# Patient Record
Sex: Female | Born: 1935 | Race: White | Hispanic: No | Marital: Married | Smoking: Never smoker
Health system: Southern US, Community
[De-identification: ages and names within clinical notes are randomized; demographics above are authoritative.]

## PROBLEM LIST (undated history)

## (undated) DIAGNOSIS — I10 Essential (primary) hypertension: Secondary | ICD-10-CM

## (undated) DIAGNOSIS — N189 Chronic kidney disease, unspecified: Secondary | ICD-10-CM

## (undated) DIAGNOSIS — R0600 Dyspnea, unspecified: Secondary | ICD-10-CM

## (undated) DIAGNOSIS — I2510 Atherosclerotic heart disease of native coronary artery without angina pectoris: Secondary | ICD-10-CM

## (undated) DIAGNOSIS — T4145XA Adverse effect of unspecified anesthetic, initial encounter: Secondary | ICD-10-CM

## (undated) DIAGNOSIS — T8859XA Other complications of anesthesia, initial encounter: Secondary | ICD-10-CM

## (undated) DIAGNOSIS — M1990 Unspecified osteoarthritis, unspecified site: Secondary | ICD-10-CM

## (undated) DIAGNOSIS — F32A Depression, unspecified: Secondary | ICD-10-CM

## (undated) DIAGNOSIS — J449 Chronic obstructive pulmonary disease, unspecified: Secondary | ICD-10-CM

## (undated) DIAGNOSIS — R32 Unspecified urinary incontinence: Secondary | ICD-10-CM

## (undated) DIAGNOSIS — J45909 Unspecified asthma, uncomplicated: Secondary | ICD-10-CM

## (undated) DIAGNOSIS — F329 Major depressive disorder, single episode, unspecified: Secondary | ICD-10-CM

## (undated) DIAGNOSIS — F419 Anxiety disorder, unspecified: Secondary | ICD-10-CM

## (undated) DIAGNOSIS — E039 Hypothyroidism, unspecified: Secondary | ICD-10-CM

## (undated) DIAGNOSIS — C801 Malignant (primary) neoplasm, unspecified: Secondary | ICD-10-CM

## (undated) HISTORY — PX: BACK SURGERY: SHX140

## (undated) HISTORY — PX: BREAST SURGERY: SHX581

## (undated) HISTORY — PX: COLOSTOMY CLOSURE: SHX1381

## (undated) HISTORY — PX: ABDOMINAL HYSTERECTOMY: SHX81

## (undated) HISTORY — PX: COLON RESECTION: SHX5231

## (undated) HISTORY — PX: CHOLECYSTECTOMY: SHX55

## (undated) HISTORY — PX: CARDIAC CATHETERIZATION: SHX172

## (undated) HISTORY — PX: EYE SURGERY: SHX253

---
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
## 1998-08-14 ENCOUNTER — Encounter: Payer: Self-pay | Admitting: *Deleted

## 1998-08-14 ENCOUNTER — Ambulatory Visit (HOSPITAL_COMMUNITY): Admission: RE | Admit: 1998-08-14 | Discharge: 1998-08-14 | Payer: Self-pay | Admitting: *Deleted

## 1998-08-17 ENCOUNTER — Ambulatory Visit (HOSPITAL_COMMUNITY): Admission: RE | Admit: 1998-08-17 | Discharge: 1998-08-18 | Payer: Self-pay | Admitting: *Deleted

## 1998-08-17 ENCOUNTER — Encounter: Payer: Self-pay | Admitting: *Deleted

## 1999-05-28 ENCOUNTER — Inpatient Hospital Stay (HOSPITAL_COMMUNITY): Admission: EM | Admit: 1999-05-28 | Discharge: 1999-05-29 | Payer: Self-pay | Admitting: *Deleted

## 1999-05-29 ENCOUNTER — Encounter: Payer: Self-pay | Admitting: Cardiology

## 1999-06-08 ENCOUNTER — Encounter: Payer: Self-pay | Admitting: Surgery

## 1999-06-08 ENCOUNTER — Encounter: Admission: RE | Admit: 1999-06-08 | Discharge: 1999-06-08 | Payer: Self-pay | Admitting: Surgery

## 1999-12-21 ENCOUNTER — Ambulatory Visit (HOSPITAL_COMMUNITY): Admission: RE | Admit: 1999-12-21 | Discharge: 1999-12-21 | Payer: Self-pay | Admitting: Internal Medicine

## 2000-01-16 ENCOUNTER — Ambulatory Visit (HOSPITAL_COMMUNITY): Admission: RE | Admit: 2000-01-16 | Discharge: 2000-01-16 | Payer: Self-pay | Admitting: *Deleted

## 2000-01-16 ENCOUNTER — Encounter: Payer: Self-pay | Admitting: *Deleted

## 2000-01-21 ENCOUNTER — Encounter: Payer: Self-pay | Admitting: *Deleted

## 2000-01-21 ENCOUNTER — Ambulatory Visit (HOSPITAL_COMMUNITY): Admission: RE | Admit: 2000-01-21 | Discharge: 2000-01-21 | Payer: Self-pay | Admitting: *Deleted

## 2000-01-31 ENCOUNTER — Encounter: Payer: Self-pay | Admitting: *Deleted

## 2000-01-31 ENCOUNTER — Inpatient Hospital Stay (HOSPITAL_COMMUNITY): Admission: RE | Admit: 2000-01-31 | Discharge: 2000-02-01 | Payer: Self-pay | Admitting: *Deleted

## 2000-02-01 ENCOUNTER — Encounter: Payer: Self-pay | Admitting: *Deleted

## 2000-04-13 ENCOUNTER — Encounter: Payer: Self-pay | Admitting: Internal Medicine

## 2000-04-13 ENCOUNTER — Observation Stay (HOSPITAL_COMMUNITY): Admission: EM | Admit: 2000-04-13 | Discharge: 2000-04-14 | Payer: Self-pay | Admitting: Emergency Medicine

## 2000-06-09 ENCOUNTER — Encounter: Payer: Self-pay | Admitting: Surgery

## 2000-06-09 ENCOUNTER — Encounter: Admission: RE | Admit: 2000-06-09 | Discharge: 2000-06-09 | Payer: Self-pay | Admitting: Surgery

## 2000-08-09 ENCOUNTER — Other Ambulatory Visit: Admission: RE | Admit: 2000-08-09 | Discharge: 2000-08-09 | Payer: Self-pay | Admitting: Family Medicine

## 2000-08-14 ENCOUNTER — Encounter: Admission: RE | Admit: 2000-08-14 | Discharge: 2000-08-14 | Payer: Self-pay | Admitting: Family Medicine

## 2000-08-14 ENCOUNTER — Encounter: Payer: Self-pay | Admitting: Family Medicine

## 2000-09-12 ENCOUNTER — Encounter: Payer: Self-pay | Admitting: Family Medicine

## 2000-09-12 ENCOUNTER — Encounter: Admission: RE | Admit: 2000-09-12 | Discharge: 2000-09-12 | Payer: Self-pay | Admitting: Family Medicine

## 2000-09-19 ENCOUNTER — Encounter (INDEPENDENT_AMBULATORY_CARE_PROVIDER_SITE_OTHER): Payer: Self-pay

## 2000-09-19 ENCOUNTER — Other Ambulatory Visit: Admission: RE | Admit: 2000-09-19 | Discharge: 2000-09-19 | Payer: Self-pay | Admitting: Gastroenterology

## 2000-09-25 ENCOUNTER — Encounter: Payer: Self-pay | Admitting: Gastroenterology

## 2000-09-25 ENCOUNTER — Ambulatory Visit (HOSPITAL_COMMUNITY): Admission: RE | Admit: 2000-09-25 | Discharge: 2000-09-25 | Payer: Self-pay | Admitting: Gastroenterology

## 2000-10-18 ENCOUNTER — Ambulatory Visit (HOSPITAL_COMMUNITY): Admission: RE | Admit: 2000-10-18 | Discharge: 2000-10-18 | Payer: Self-pay | Admitting: Gastroenterology

## 2000-10-18 ENCOUNTER — Encounter (INDEPENDENT_AMBULATORY_CARE_PROVIDER_SITE_OTHER): Payer: Self-pay | Admitting: Specialist

## 2000-11-24 ENCOUNTER — Ambulatory Visit (HOSPITAL_COMMUNITY): Admission: RE | Admit: 2000-11-24 | Discharge: 2000-11-24 | Payer: Self-pay | Admitting: Internal Medicine

## 2000-12-08 ENCOUNTER — Encounter: Payer: Self-pay | Admitting: Gastroenterology

## 2000-12-08 ENCOUNTER — Ambulatory Visit (HOSPITAL_COMMUNITY): Admission: RE | Admit: 2000-12-08 | Discharge: 2000-12-08 | Payer: Self-pay | Admitting: Gastroenterology

## 2001-04-24 ENCOUNTER — Encounter: Payer: Self-pay | Admitting: Surgery

## 2001-04-24 ENCOUNTER — Encounter: Admission: RE | Admit: 2001-04-24 | Discharge: 2001-04-24 | Payer: Self-pay | Admitting: Surgery

## 2002-05-15 ENCOUNTER — Encounter: Admission: RE | Admit: 2002-05-15 | Discharge: 2002-05-15 | Payer: Self-pay | Admitting: Internal Medicine

## 2002-05-15 ENCOUNTER — Encounter: Payer: Self-pay | Admitting: Internal Medicine

## 2002-08-11 ENCOUNTER — Emergency Department (HOSPITAL_COMMUNITY): Admission: EM | Admit: 2002-08-11 | Discharge: 2002-08-11 | Payer: Self-pay | Admitting: Emergency Medicine

## 2003-02-12 ENCOUNTER — Encounter: Payer: Self-pay | Admitting: Internal Medicine

## 2003-02-12 ENCOUNTER — Inpatient Hospital Stay (HOSPITAL_COMMUNITY): Admission: AD | Admit: 2003-02-12 | Discharge: 2003-02-14 | Payer: Self-pay | Admitting: Internal Medicine

## 2003-02-13 ENCOUNTER — Encounter: Payer: Self-pay | Admitting: Internal Medicine

## 2003-02-14 ENCOUNTER — Encounter: Payer: Self-pay | Admitting: Internal Medicine

## 2003-06-04 ENCOUNTER — Encounter: Admission: RE | Admit: 2003-06-04 | Discharge: 2003-06-04 | Payer: Self-pay | Admitting: Internal Medicine

## 2003-11-12 ENCOUNTER — Inpatient Hospital Stay (HOSPITAL_COMMUNITY): Admission: EM | Admit: 2003-11-12 | Discharge: 2003-11-17 | Payer: Self-pay | Admitting: Emergency Medicine

## 2004-01-08 ENCOUNTER — Ambulatory Visit (HOSPITAL_COMMUNITY): Admission: RE | Admit: 2004-01-08 | Discharge: 2004-01-08 | Payer: Self-pay | Admitting: Internal Medicine

## 2004-04-13 ENCOUNTER — Ambulatory Visit: Payer: Self-pay | Admitting: Internal Medicine

## 2004-04-13 ENCOUNTER — Inpatient Hospital Stay (HOSPITAL_COMMUNITY): Admission: EM | Admit: 2004-04-13 | Discharge: 2004-04-16 | Payer: Self-pay | Admitting: Emergency Medicine

## 2004-04-13 ENCOUNTER — Ambulatory Visit: Payer: Self-pay | Admitting: *Deleted

## 2004-04-14 ENCOUNTER — Ambulatory Visit: Payer: Self-pay | Admitting: Internal Medicine

## 2004-04-15 ENCOUNTER — Encounter: Payer: Self-pay | Admitting: Cardiology

## 2004-04-16 ENCOUNTER — Ambulatory Visit: Payer: Self-pay | Admitting: Internal Medicine

## 2004-04-21 ENCOUNTER — Ambulatory Visit: Payer: Self-pay | Admitting: Internal Medicine

## 2004-05-12 ENCOUNTER — Ambulatory Visit: Payer: Self-pay | Admitting: Internal Medicine

## 2004-06-15 ENCOUNTER — Encounter: Admission: RE | Admit: 2004-06-15 | Discharge: 2004-06-15 | Payer: Self-pay | Admitting: Surgery

## 2004-07-13 ENCOUNTER — Ambulatory Visit: Payer: Self-pay | Admitting: Family Medicine

## 2004-08-10 ENCOUNTER — Ambulatory Visit: Payer: Self-pay | Admitting: Internal Medicine

## 2004-09-01 ENCOUNTER — Ambulatory Visit: Payer: Self-pay | Admitting: Internal Medicine

## 2004-09-21 ENCOUNTER — Ambulatory Visit: Payer: Self-pay | Admitting: Family Medicine

## 2004-09-29 ENCOUNTER — Ambulatory Visit: Payer: Self-pay | Admitting: Internal Medicine

## 2004-10-27 ENCOUNTER — Ambulatory Visit: Payer: Self-pay | Admitting: Gastroenterology

## 2004-11-02 ENCOUNTER — Ambulatory Visit: Payer: Self-pay | Admitting: Gastroenterology

## 2004-11-24 ENCOUNTER — Ambulatory Visit: Payer: Self-pay | Admitting: Gastroenterology

## 2004-12-01 ENCOUNTER — Ambulatory Visit (HOSPITAL_COMMUNITY): Admission: RE | Admit: 2004-12-01 | Discharge: 2004-12-01 | Payer: Self-pay | Admitting: Gastroenterology

## 2004-12-01 ENCOUNTER — Ambulatory Visit: Payer: Self-pay | Admitting: Internal Medicine

## 2004-12-13 ENCOUNTER — Ambulatory Visit: Payer: Self-pay | Admitting: Family Medicine

## 2004-12-15 ENCOUNTER — Ambulatory Visit: Payer: Self-pay | Admitting: Internal Medicine

## 2004-12-17 ENCOUNTER — Ambulatory Visit: Payer: Self-pay | Admitting: Gastroenterology

## 2004-12-22 ENCOUNTER — Ambulatory Visit: Payer: Self-pay | Admitting: Internal Medicine

## 2005-01-05 ENCOUNTER — Ambulatory Visit: Payer: Self-pay | Admitting: Internal Medicine

## 2005-01-14 ENCOUNTER — Ambulatory Visit: Payer: Self-pay | Admitting: Gastroenterology

## 2005-03-23 ENCOUNTER — Ambulatory Visit: Payer: Self-pay | Admitting: Internal Medicine

## 2005-05-18 ENCOUNTER — Ambulatory Visit: Payer: Self-pay | Admitting: Internal Medicine

## 2005-06-08 ENCOUNTER — Ambulatory Visit: Payer: Self-pay | Admitting: Internal Medicine

## 2005-06-17 ENCOUNTER — Encounter: Admission: RE | Admit: 2005-06-17 | Discharge: 2005-06-17 | Payer: Self-pay | Admitting: Internal Medicine

## 2005-07-13 ENCOUNTER — Ambulatory Visit: Payer: Self-pay | Admitting: Internal Medicine

## 2005-09-14 ENCOUNTER — Ambulatory Visit: Payer: Self-pay | Admitting: Internal Medicine

## 2005-09-19 ENCOUNTER — Ambulatory Visit: Payer: Self-pay | Admitting: Internal Medicine

## 2005-09-21 ENCOUNTER — Ambulatory Visit: Payer: Self-pay | Admitting: Internal Medicine

## 2005-09-26 ENCOUNTER — Ambulatory Visit: Payer: Self-pay | Admitting: Internal Medicine

## 2005-09-29 ENCOUNTER — Ambulatory Visit: Payer: Self-pay | Admitting: Internal Medicine

## 2005-09-30 ENCOUNTER — Inpatient Hospital Stay (HOSPITAL_COMMUNITY): Admission: EM | Admit: 2005-09-30 | Discharge: 2005-10-03 | Payer: Self-pay | Admitting: Emergency Medicine

## 2005-10-03 ENCOUNTER — Ambulatory Visit: Payer: Self-pay | Admitting: Internal Medicine

## 2005-10-04 ENCOUNTER — Ambulatory Visit: Payer: Self-pay | Admitting: Internal Medicine

## 2005-10-06 ENCOUNTER — Ambulatory Visit: Payer: Self-pay | Admitting: Internal Medicine

## 2005-10-18 ENCOUNTER — Ambulatory Visit: Payer: Self-pay | Admitting: Gastroenterology

## 2005-10-25 ENCOUNTER — Ambulatory Visit: Payer: Self-pay | Admitting: Gastroenterology

## 2005-11-09 ENCOUNTER — Ambulatory Visit: Payer: Self-pay | Admitting: Internal Medicine

## 2006-02-01 ENCOUNTER — Ambulatory Visit: Payer: Self-pay | Admitting: Internal Medicine

## 2006-04-18 ENCOUNTER — Ambulatory Visit: Payer: Self-pay | Admitting: Internal Medicine

## 2006-04-19 ENCOUNTER — Ambulatory Visit: Payer: Self-pay

## 2006-04-25 ENCOUNTER — Observation Stay (HOSPITAL_COMMUNITY): Admission: EM | Admit: 2006-04-25 | Discharge: 2006-04-26 | Payer: Self-pay | Admitting: Emergency Medicine

## 2006-04-25 ENCOUNTER — Ambulatory Visit: Payer: Self-pay | Admitting: Cardiology

## 2006-05-03 ENCOUNTER — Ambulatory Visit: Payer: Self-pay | Admitting: Internal Medicine

## 2006-05-12 ENCOUNTER — Ambulatory Visit (HOSPITAL_COMMUNITY): Admission: RE | Admit: 2006-05-12 | Discharge: 2006-05-12 | Payer: Self-pay | Admitting: Internal Medicine

## 2006-05-22 ENCOUNTER — Ambulatory Visit: Payer: Self-pay | Admitting: Internal Medicine

## 2006-06-16 ENCOUNTER — Ambulatory Visit: Payer: Self-pay | Admitting: Internal Medicine

## 2006-06-19 ENCOUNTER — Encounter: Admission: RE | Admit: 2006-06-19 | Discharge: 2006-06-19 | Payer: Self-pay | Admitting: Internal Medicine

## 2006-06-19 ENCOUNTER — Ambulatory Visit: Payer: Self-pay | Admitting: Internal Medicine

## 2006-06-19 LAB — CONVERTED CEMR LAB
BUN: 18 mg/dL (ref 6–23)
CO2: 30 meq/L (ref 19–32)
GFR calc non Af Amer: 46 mL/min
Glomerular Filtration Rate, Af Am: 56 mL/min/{1.73_m2}
Potassium: 3.6 meq/L (ref 3.5–5.1)

## 2006-06-20 ENCOUNTER — Ambulatory Visit: Payer: Self-pay | Admitting: Internal Medicine

## 2006-07-06 ENCOUNTER — Ambulatory Visit: Payer: Self-pay | Admitting: Internal Medicine

## 2006-07-11 ENCOUNTER — Encounter: Admission: RE | Admit: 2006-07-11 | Discharge: 2006-07-11 | Payer: Self-pay | Admitting: Surgery

## 2006-09-22 ENCOUNTER — Ambulatory Visit: Payer: Self-pay | Admitting: Internal Medicine

## 2006-09-22 ENCOUNTER — Ambulatory Visit: Payer: Self-pay

## 2006-09-28 ENCOUNTER — Ambulatory Visit: Payer: Self-pay | Admitting: Internal Medicine

## 2006-10-16 ENCOUNTER — Ambulatory Visit: Payer: Self-pay | Admitting: Internal Medicine

## 2006-11-09 ENCOUNTER — Ambulatory Visit (HOSPITAL_BASED_OUTPATIENT_CLINIC_OR_DEPARTMENT_OTHER): Admission: RE | Admit: 2006-11-09 | Discharge: 2006-11-09 | Payer: Self-pay | Admitting: Orthopedic Surgery

## 2007-01-05 DIAGNOSIS — F3289 Other specified depressive episodes: Secondary | ICD-10-CM | POA: Insufficient documentation

## 2007-01-05 DIAGNOSIS — K222 Esophageal obstruction: Secondary | ICD-10-CM

## 2007-01-05 DIAGNOSIS — Z8719 Personal history of other diseases of the digestive system: Secondary | ICD-10-CM

## 2007-01-05 DIAGNOSIS — E785 Hyperlipidemia, unspecified: Secondary | ICD-10-CM

## 2007-01-05 DIAGNOSIS — I2510 Atherosclerotic heart disease of native coronary artery without angina pectoris: Secondary | ICD-10-CM | POA: Insufficient documentation

## 2007-01-05 DIAGNOSIS — J449 Chronic obstructive pulmonary disease, unspecified: Secondary | ICD-10-CM

## 2007-01-05 DIAGNOSIS — R55 Syncope and collapse: Secondary | ICD-10-CM | POA: Insufficient documentation

## 2007-01-05 DIAGNOSIS — Z853 Personal history of malignant neoplasm of breast: Secondary | ICD-10-CM

## 2007-01-05 DIAGNOSIS — M109 Gout, unspecified: Secondary | ICD-10-CM

## 2007-01-05 DIAGNOSIS — F329 Major depressive disorder, single episode, unspecified: Secondary | ICD-10-CM

## 2007-01-05 DIAGNOSIS — M4800 Spinal stenosis, site unspecified: Secondary | ICD-10-CM

## 2007-01-05 DIAGNOSIS — F411 Generalized anxiety disorder: Secondary | ICD-10-CM | POA: Insufficient documentation

## 2007-01-05 DIAGNOSIS — I10 Essential (primary) hypertension: Secondary | ICD-10-CM | POA: Insufficient documentation

## 2007-01-05 DIAGNOSIS — R609 Edema, unspecified: Secondary | ICD-10-CM

## 2007-01-05 DIAGNOSIS — E039 Hypothyroidism, unspecified: Secondary | ICD-10-CM | POA: Insufficient documentation

## 2007-01-05 DIAGNOSIS — J4489 Other specified chronic obstructive pulmonary disease: Secondary | ICD-10-CM | POA: Insufficient documentation

## 2007-02-08 ENCOUNTER — Ambulatory Visit: Payer: Self-pay | Admitting: Internal Medicine

## 2007-02-08 LAB — CONVERTED CEMR LAB
ALT: 24 units/L (ref 0–35)
AST: 22 units/L (ref 0–37)
Calcium: 9.8 mg/dL (ref 8.4–10.5)
Chloride: 97 meq/L (ref 96–112)
GFR calc non Af Amer: 57 mL/min
HDL: 57.9 mg/dL (ref >39.0)
Hemoglobin, Urine: NEGATIVE
Specific Gravity, Urine: 1.015 (ref 1.000–1.03)
TSH: 4.62 microintl units/mL (ref 0.35–5.50)
Total Protein, Urine: NEGATIVE mg/dL
VLDL: 28 mg/dL (ref 0–40)
pH: 5.5 (ref 5.0–8.0)

## 2007-04-27 ENCOUNTER — Ambulatory Visit: Payer: Self-pay | Admitting: Internal Medicine

## 2007-04-27 DIAGNOSIS — G4700 Insomnia, unspecified: Secondary | ICD-10-CM

## 2007-05-09 ENCOUNTER — Telehealth: Payer: Self-pay | Admitting: Internal Medicine

## 2007-05-11 ENCOUNTER — Ambulatory Visit: Payer: Self-pay | Admitting: Internal Medicine

## 2007-05-11 DIAGNOSIS — R071 Chest pain on breathing: Secondary | ICD-10-CM

## 2007-05-14 ENCOUNTER — Telehealth: Payer: Self-pay | Admitting: Internal Medicine

## 2007-05-15 ENCOUNTER — Encounter: Payer: Self-pay | Admitting: Internal Medicine

## 2007-05-16 ENCOUNTER — Telehealth: Payer: Self-pay | Admitting: Internal Medicine

## 2007-05-21 ENCOUNTER — Telehealth (INDEPENDENT_AMBULATORY_CARE_PROVIDER_SITE_OTHER): Payer: Self-pay | Admitting: *Deleted

## 2007-06-12 ENCOUNTER — Encounter: Payer: Self-pay | Admitting: Internal Medicine

## 2007-06-18 ENCOUNTER — Ambulatory Visit: Payer: Self-pay | Admitting: Internal Medicine

## 2007-06-18 ENCOUNTER — Telehealth: Payer: Self-pay | Admitting: Internal Medicine

## 2007-06-18 DIAGNOSIS — N3000 Acute cystitis without hematuria: Secondary | ICD-10-CM

## 2007-06-18 LAB — CONVERTED CEMR LAB
Bilirubin Urine: NEGATIVE
Specific Gravity, Urine: 1.01 (ref 1.000–1.03)
Total Protein, Urine: NEGATIVE mg/dL
Urine Glucose: NEGATIVE mg/dL
pH: 5.5 (ref 5.0–8.0)

## 2007-06-20 ENCOUNTER — Ambulatory Visit: Payer: Self-pay | Admitting: Internal Medicine

## 2007-06-20 DIAGNOSIS — N390 Urinary tract infection, site not specified: Secondary | ICD-10-CM

## 2007-06-20 DIAGNOSIS — J069 Acute upper respiratory infection, unspecified: Secondary | ICD-10-CM | POA: Insufficient documentation

## 2007-06-20 DIAGNOSIS — M26629 Arthralgia of temporomandibular joint, unspecified side: Secondary | ICD-10-CM

## 2007-06-27 ENCOUNTER — Encounter: Payer: Self-pay | Admitting: Internal Medicine

## 2007-07-13 ENCOUNTER — Encounter: Admission: RE | Admit: 2007-07-13 | Discharge: 2007-07-13 | Payer: Self-pay | Admitting: Surgery

## 2007-07-27 ENCOUNTER — Encounter: Payer: Self-pay | Admitting: Internal Medicine

## 2007-08-09 ENCOUNTER — Encounter: Payer: Self-pay | Admitting: Internal Medicine

## 2007-08-13 ENCOUNTER — Encounter: Payer: Self-pay | Admitting: Internal Medicine

## 2007-08-24 ENCOUNTER — Encounter: Payer: Self-pay | Admitting: Internal Medicine

## 2007-10-10 ENCOUNTER — Telehealth: Payer: Self-pay | Admitting: Internal Medicine

## 2007-10-16 ENCOUNTER — Ambulatory Visit: Payer: Self-pay | Admitting: Internal Medicine

## 2007-10-16 ENCOUNTER — Telehealth: Payer: Self-pay | Admitting: Internal Medicine

## 2007-10-16 DIAGNOSIS — J309 Allergic rhinitis, unspecified: Secondary | ICD-10-CM | POA: Insufficient documentation

## 2008-02-01 ENCOUNTER — Telehealth: Payer: Self-pay | Admitting: Internal Medicine

## 2008-02-04 ENCOUNTER — Telehealth: Payer: Self-pay | Admitting: Internal Medicine

## 2008-02-06 ENCOUNTER — Encounter: Payer: Self-pay | Admitting: Internal Medicine

## 2008-07-25 ENCOUNTER — Encounter: Admission: RE | Admit: 2008-07-25 | Discharge: 2008-07-25 | Payer: Self-pay | Admitting: Surgery

## 2008-07-31 ENCOUNTER — Encounter: Admission: RE | Admit: 2008-07-31 | Discharge: 2008-07-31 | Payer: Self-pay | Admitting: Surgery

## 2008-08-08 ENCOUNTER — Ambulatory Visit: Payer: Self-pay | Admitting: Internal Medicine

## 2008-08-18 ENCOUNTER — Ambulatory Visit: Payer: Self-pay | Admitting: Internal Medicine

## 2008-09-02 ENCOUNTER — Ambulatory Visit: Payer: Self-pay | Admitting: Internal Medicine

## 2008-09-02 ENCOUNTER — Encounter: Payer: Self-pay | Admitting: Internal Medicine

## 2009-07-29 ENCOUNTER — Encounter: Admission: RE | Admit: 2009-07-29 | Discharge: 2009-07-29 | Payer: Self-pay | Admitting: Family Medicine

## 2009-08-14 ENCOUNTER — Encounter: Payer: Self-pay | Admitting: Internal Medicine

## 2009-09-11 ENCOUNTER — Telehealth: Payer: Self-pay | Admitting: Internal Medicine

## 2009-09-15 ENCOUNTER — Telehealth: Payer: Self-pay | Admitting: Internal Medicine

## 2009-09-15 ENCOUNTER — Encounter: Payer: Self-pay | Admitting: Internal Medicine

## 2009-11-17 ENCOUNTER — Ambulatory Visit: Admission: RE | Admit: 2009-11-17 | Discharge: 2009-11-17 | Payer: Self-pay | Admitting: Orthopedic Surgery

## 2009-11-17 ENCOUNTER — Encounter (INDEPENDENT_AMBULATORY_CARE_PROVIDER_SITE_OTHER): Payer: Self-pay | Admitting: Orthopedic Surgery

## 2009-11-17 ENCOUNTER — Ambulatory Visit: Payer: Self-pay | Admitting: Vascular Surgery

## 2009-12-08 ENCOUNTER — Telehealth (INDEPENDENT_AMBULATORY_CARE_PROVIDER_SITE_OTHER): Payer: Self-pay | Admitting: *Deleted

## 2010-07-04 ENCOUNTER — Encounter: Payer: Self-pay | Admitting: Internal Medicine

## 2010-07-14 ENCOUNTER — Other Ambulatory Visit: Payer: Self-pay | Admitting: Surgery

## 2010-07-14 DIAGNOSIS — Z9889 Other specified postprocedural states: Secondary | ICD-10-CM

## 2010-07-14 DIAGNOSIS — Z1239 Encounter for other screening for malignant neoplasm of breast: Secondary | ICD-10-CM

## 2010-07-15 NOTE — Medication Information (Signed)
Summary: Prior [facility]ho for Xyzal/[facility]  Prior [facility] for Xyzal/[facility]   Imported By: Sherian Rein 09/17/2009 07:29:17  _____________________________________________________________________  External Attachment:    Type:   Image     Comment:   External Document

## 2010-07-15 NOTE — Medication Information (Signed)
Summary: Denial/[facility]  Denial/[facility]   Imported By: Lester Norphlet 09/18/2009 08:49:34  _____________________________________________________________________  External Attachment:    Type:   Image     Comment:   External Document

## 2010-07-15 NOTE — Progress Notes (Signed)
Summary: Xyzal pa  Phone Note Other Incoming   Caller: [facility] [phone]:[id number] Summary of Call: The office rec'd letter that Levoetirizine (Xyzal) is not covered by [employer]. I was unable to speak with representative, requested fax form via automated system. Initial call taken by: Lucious Groves,  September 11, 2009 8:57 AM  Follow-up for Phone Call        I have not rec'd forms, requested again. Follow-up by: Lucious Groves,  September 14, 2009 4:04 PM  Additional Follow-up for Phone Call Additional follow up Details #1::        Form completed and faxed, will await insurance company reply. Additional Follow-up by: Lucious Groves,  September 15, 2009 9:49 AM

## 2010-07-15 NOTE — Medication Information (Signed)
Summary: AARP MedicareComplete  AARP MedicareComplete   Imported By: Lester [place] 09/18/2009 08:58:39  _____________________________________________________________________  External Attachment:    Type:   Image     Comment:   External Document

## 2010-07-15 NOTE — Progress Notes (Signed)
Summary: Xyzal denied  Phone Note Other Incoming   Summary of Call: Ins company has denied Xyzal prior authorization stating that the patient must try and fail both Allegra (fexofenadine) and Zyrtec (cerizine). Would you like to change patient med or mail appeal letter? Please advise. Initial call taken by: Lucious Groves,  September 15, 2009 4:02 PM  Follow-up for Phone Call        Patient left my practice August '09 and went to [facility] in [place].  I will not fill Rx. Follow-up by: Jacques Navy MD,  September 16, 2009 10:33 AM  Additional Follow-up for Phone Call Additional follow up Details #1::        informed pt Additional Follow-up by: Ami Bullins CMA,  September 16, 2009 11:20 AM

## 2010-07-15 NOTE — Progress Notes (Signed)
   Faxed Cardiac Records over to [facility]. [name]  December 08, 2009 11:57 AM

## 2010-07-30 ENCOUNTER — Ambulatory Visit: Payer: Self-pay

## 2010-08-02 ENCOUNTER — Ambulatory Visit: Payer: Self-pay

## 2010-08-10 ENCOUNTER — Ambulatory Visit
Admission: RE | Admit: 2010-08-10 | Discharge: 2010-08-10 | Disposition: A | Discharge status: Home / Self Care | Payer: MEDICARE | Source: Ambulatory Visit | Attending: Surgery | Admitting: Surgery

## 2010-08-10 DIAGNOSIS — Z9889 Other specified postprocedural states: Secondary | ICD-10-CM

## 2010-08-10 DIAGNOSIS — Z1239 Encounter for other screening for malignant neoplasm of breast: Secondary | ICD-10-CM

## 2010-10-26 NOTE — Letter (Signed)
Oct 16, 2006     RE:  FATEMAH, POURCIAU  MRN:  [id number]  /  DOB:  [birth year]   Dear Milford Cage or Estill Batten,   Ms. Tinslee Klare is a patient I follow in my practice for general  medical purposes. She is treated for multiple medical problems including  hypertension, GERD and hypothyroid disease.   Ms. Facchini has end-stage joint disease involving both knees and will be  coming into total knee replacement in the near future. If she does well  with that surgery, she does plan to have the second knee done as well.   Because of Ms. Liby's already significant medical problems plus the  recovery period anticipated from total knee replacements, she will need  to have help at the home following her knee replacement surgery.   Your assistance and consideration in regards to her getting in-home care  after her surgery is greatly appreciated. If I can provide any  additional information, please do not hesitate to contact me.    Sincerely,      Rosalyn Gess. Norins, MD  Electronically Signed    MEN/MedQ  DD: 10/16/2006  DT: 10/16/2006  Job #: [id number]

## 2010-10-26 NOTE — Op Note (Signed)
[name], [name]                ACCOUNT NO.:  [id number]   MEDICAL RECORD NO.:  [id number]          PATIENT TYPE:  AMB   LOCATION:  [facility]                         FACILITY:  [facility]   PHYSICIAN:  Deidre Ala, M.D.    DATE OF BIRTH:  [birth year]   DATE OF PROCEDURE:  11/09/2006  DATE OF DISCHARGE:                               OPERATIVE REPORT   PREOPERATIVE DIAGNOSIS:  End-stage degenerative joint disease, right  knee, with degenerative medial and lateral meniscus tears.   POSTOPERATIVE DIAGNOSES:  1. Grade 3 to 4 tricompartmental degenerative joint disease      throughout.  2. Marked degenerative medial and lateral meniscus tears, posterior      and anterior, with pseudogout.  3. Tricompartment synovitis.  4. 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   PROCEDURE:  1. Right knee operative arthroscopy with medial and lateral partial      meniscectomies.  2. Abrasion and ablation chondroplasties, tricompartment.  3. Arthroscopic lateral retinacular release.  4. Tricompartment synovectomy.   SURGEON:  1. Charlesetta Shanks, M.D.   ASSISTANT:  Phineas Semen, P.A.-C.   ANESTHESIA:  General with LMA.   CULTURES:  None.   DRAINS:  None.   ESTIMATED BLOOD LOSS:  Minimal.   TOURNIQUET TIME:  40 minutes.   PATHOLOGIC FINDINGS AND HISTORY:  [name] has had ever increasing knee  pain recalcitrant to cortisone injections.  It was our feeling that  showed significant DJD with bone on bone, especially in the medial  compartment.  Dr. Debby Bud cleared her for surgery.  We discussed with her  thoroughly the fact that she may have to go on to total knee  replacement.  She was not ready for this.  She wanted some relief.  Even  with low percentages, she wanted to try an arthroscopic debridement  first, so she therefore elected to proceed.  At surgery, she had  profound loss of cartilage over the entire femoral trochlea area with  large osteophytes,  very tight lateral retinaculum with marked lateral  patellar tilt and track and excoriation of cartilage to bone.  Her ACL  was somewhat attenuated but still intact.  She had a marked degenerative  medial and lateral meniscus tear with multiple areas of fraying with  pseudogout crystals, DJD grade 3-4 in the medial compartment, lateral  grade 2-3, and marked plica synovitis throughout.  All this was  debrided, smoothed, and ablated in the hopes of using Supartz or Hyalgan  and perhaps avoiding a total knee replacement.   PROCEDURE:  With adequate anesthesia obtained using LMA technique, 1  gram Ancef given IV prophylaxis, the patient was placed in the supine  position.  The right lower extremity was prepped from the malleoli to  the leg holder in the standard fashion.  After standard prepping and  draping, Esmarch exsanguination was used.  The tourniquet was let up to  350 mmHg.   A superolateral inflow portal was made.  The knee was insufflated with  normal saline with the arthroscopic pump.  Medial and lateral scope  portals were then made, and the joint was thoroughly inspected.  I then  shaved out the medial plica back to the sidewall and lysed the medial  band.  I then shaved and smoothed the remnants of cartilage on the  patellofemoral surface.  I then did an extensive debridement of the  medial meniscus with basket and shaver and smoothed with the ablator on  1 as well as the medial femoral condyle and medial tibial plateau.  The  portals were reversed, and I then saucerized the lateral meniscus,  shaved and smoothed, used the ablator on the joint surface as well as  the meniscus edge.  I then shaved out the lateral plica, observed tilt  and track, and did an arthroscopic lateral retinacular release from  vastus lateralis to the joint line.  Bleeding points were cauterized.  Pouch synovitis was also resected as well as all pseudogout crystals.  The knee was then irrigated through  the scope, and 0.5% Marcaine was  injected in and about the portals.  The portals were left open.  A bulky  sterile compressive dressing was applied with lateral foam pad for  tamponade and easy wrap placed.   The patient, having tolerated the procedure well, was awakened and taken  to recovery room in satisfactory condition to be discharged per  outpatient routine, given Percocet for pain and told to call the office  for recheck tomorrow.           ______________________________  V. Charlesetta Shanks, M.D.     VEP/MEDQ  D:  11/09/2006  T:  11/09/2006  Job:  [id number]   cc:   Tammy Gess. Norins, MD  [address]  [place]  [place] [address]

## 2010-10-26 NOTE — Letter (Signed)
August 08, 2008    Dr. Renae Fickle [facility]  [address]  [place], [place]  [address]   RE:  [name], [name]  MRN:  [id number]  /  DOB:  [birth year]   Dear [name]:   I am looking forward to putting a face to the name.  I am sorry you are  not going to be able to join [place] for dinner on Monday.  It was a pleasure  seeing Iola Turri, although from the onset I am not quite sure that I  have very many answers to offer for her.   She is a 77 year old woman who has a longstanding history of syncope.  There are spells that have been going back for 10 to 20 years that  occurred a couple of times a month.  She was treated with Florinef,  apparently had a tilt table test that maybe even I did a decade and a  half ago and these were significantly improved with reduction in her  frequency.  Over the last 8 months, she has had different types of  spells.  It is for these that she is referred and it is these that we  spent a long time trying to clarify and I do not have a good handle on  it even after that time.   She describes that she has universal orthostatic dizziness.  Going from  lying to sitting or sitting to standing, she has to hold position for  some seconds so as to make sure that she will have the ability to stand  (see below physical examination).  Once her dizziness is abated, she  then proceeds to walk.  She is then interrupted without warning, falls  to the ground which has been associated with injury to herself and to  surrounding things like furniture.  She is not able to get up off the  ground easily when these are over.  These episodes appear to be quite  brief and quite rapid in onset.   Interestingly, Dr. Bing Matter from [facility] describes having  seen one of these episodes in his office.  The patient says that he was  not in the room when it occurred.  His note reads as follows, Even more  interesting, in my office while we were  trying to do orthostatic changes  on her, she was lying flat on the table and we tried to sit her up and  maybe elevate her head about 20 degrees, she passed out.  She did not  respond to any verbal stimuli.  Interestingly, she had a pulse all  through this ordeal.  We checked her blood pressure immediately and it  was elevated at 180/100.  It almost looked like a drop attack.  She has  had a variety of recorders on in the past with these episodes and Dr.  Bing Matter tried to use a CardioNet monitor.  However, she was unable to  wear the monitor even for the 4 days because of significant intolerance  to the patches.   Her cardiac evaluation includes a catheterization in 2005 demonstrating  nonobstructive coronary disease, recent evaluation at [facility] with an echo that was essentially normal in September 2009.  There may have been an intercurrent Myoview as well that I do not have  access to.   PAST MEDICAL HISTORY:  Broadly negative and includes allergies, fatigue,  reflux, arthritis, gout, thyroid disease, anxiety/depression, right-  sided breast cancer.  PAST SURGICAL HISTORY:  Notable for appendectomy, cholecystectomy and  back surgery and breast cancer surgery.   She is separated from her husband.  She has 6 children.  She does not  use cigarettes, alcohol or recreational drugs.  She is disabled.   CURRENT MEDICATIONS:  Include Lipitor 80, amlodipine 2.5, nabumetone  1500, Lovaza 1 gram b.i.d., Levoxyl 150, omeprazole, Xyzal, Klor-Con,  phenytoin 300 h.s., acebutolol 200 b.i.d., furosemide 29 and Benadryl.   She is allergic to Novocain.   PHYSICAL EXAMINATION:  On examination, she is an elderly Caucasian  female, hardly able to stand, unable to change positions without having  to sit or stand for 5 to 20 seconds prior to the next move.  VITAL SIGNS:  Her blood pressure 126/73 with a pulse of 58 lying,  sitting it was 137/80 with a pulse of 63.  Standing at zero  minutes was  143/84 with a pulse of 67 and at 5 minutes of standing was 169/89 with a  pulse of 66.  HEENT:  Demonstrated no icterus or xanthoma.  Her neck veins were flat.  The carotids are brisk and full bilaterally without bruits.  BACK:  Without kyphosis or scoliosis.  LUNGS:  Clear.  HEART:  Sounds were regular.  There was a 2/6 murmur and S4 was present.  ABDOMEN:  Soft with active bowel sounds.  Femoral pulses were 2+, distal  pulses were intact.  EXTREMITIES:  There was no clubbing, cyanosis or edema.  NEUROLOGIC:  Grossly normal with intact finger-nose and no evidence of  nystagmus either during dizziness or not during dizziness.  There was no  clubbing or cyanosis.  There is 1+ peripheral edema.  SKIN:  Warm and dry.   Electrocardiogram dated today demonstrated sinus rhythm at 58 with  intervals of 0.16/0.08/0.41.  The electrocardiogram was otherwise  normal.   IMPRESSION:  1. Recurrent falls, abrupt in onset, not clearly arrhythmic, see Dr.      Vanetta Shawl note above, and not necessarily associated with low      blood pressure.  2. Positional dizziness - profound.  3. Prior history of vasomotor syncope apparently with an abnormal tilt      table test.  4. Normal left ventricular function nonobstructive coronary disease.  5. Multiple neuroleptics.  6. Hypertension.   [name], Mrs. Baldo has significant debilitating symptoms that are  frightful to her and an enigma to me.  I thought at first blush it is  imperative that we try to exclude a transient arrhythmia because this  certainly could have been missed at the orthostatic vital signs in the  cardiologist's office.  The only way to do this is with real-time  monitoring.  I have given her different types of patches to wear and we  will see if she can tolerate any of them and then we can get a mobile  cardiac outpatient telemetry unit and see if we cannot clarify the  presence or absence of an arrhythmia with her  spells.   She apparently is to see the neurologist again in followup.  All she  recalls from that initial evaluation was that these were not seizures.  It may be worth having the Dilantin stopped if that is the case, but  neurological input is clearly going to be key here.   Following that information and knowing that it is not an arrhythmia,  tilt table testing might be of value in that there can be very brief and  rapid changes in  blood pressure.  Again, these might have been missed in  Dr. Vanetta Shawl office and this would require tilt table testing with an  intra-arterial line.  It might also be useful to see if we can arrange  to do with a transcranial Doppler.   I have to confess though that I do not have a good answer here and I am  not even quite sure how fruitful the aforementioned testing will be, but  I think it has the potential to help, little likelihood to hurt, and  might find a reversible and treatable problem.   Thanks very much for asking [place] to see her.    Sincerely,      Duke Salvia, MD, [facility]  Electronically Signed    SCK/MedQ  DD: 08/08/2008  DT: 08/08/2008  Job #: [id number]

## 2010-10-29 NOTE — Discharge Summary (Signed)
[name], [name]                ACCOUNT NO.:  [id number]   MEDICAL RECORD NO.:  [id number]          PATIENT TYPE:  INP   LOCATION:  5501                         FACILITY:  [facility]   PHYSICIAN:  Rosalyn Gess. Norins, M.D. LHCDATE OF BIRTH:  06-19-1927   DATE OF ADMISSION:  09/30/2005  DATE OF DISCHARGE:  10/03/2005                                 DISCHARGE SUMMARY   ADMITTING DIAGNOSES:  1.  Lower gastrointestinal bleed.  2.  Chronic hypokalemia.   DISCHARGE DIAGNOSES:  1.  Diverticular bleed, stopped.  2.  Hypokalemia under treatment.   CONSULTS:  Dr. Leone Payor for GI   PROCEDURES:  CT scan of the abdomen and pelvis with no lesions in the  abdomen, small hiatal hernia, status post cholecystectomy, normal pelvis.   HISTORY OF PRESENT ILLNESS:  [name] is a 77 year old woman with multiple  medical problems including COPD and hypokalemia who on the day of admission  had had recurrent stools with bright red blood per rectum going on for two  days.  She noted the first bowel movement with blood had large clots and a  large amount of blood in the commode and she had two subsequent episodes of  significant bleeding over the next day.  Patient reported feeling increase  in the amount of dizziness and lightheadedness.  She called the office and  was advised to report to the emergency department for evaluation and  probable admission.  Patient had been passing dark stools.  She has not been  taking nonsteroidal anti-inflammatory drugs.  She is on colchicine for gout.  Patient also has been treated for hypokalemia.   Please see the admission note for complete past medical history, family  history, social history, and current medications.   LABORATORIES AT ADMISSION:  Sodium 141, potassium 2.6, chloride 98, CO2 34,  BUN 33, creatinine 1.8.  Hemoglobin was 11.3 g.   Admission examination was unremarkable with lungs being clear, abdomen being  soft.   HOSPITAL COURSE:  #1 - GI:  The  patient with probable lower GI bleed thought  to be diverticular in nature.  She was followed closely.  CT scan was  obtained to rule out any additional process or signs of ischemic bowel and  this was a negative study.  Patient continued to do well with no recurrent  bleeding.  Her hemoglobin remained stable.  Laboratories available at time  of dictation from the 21st was 10.4 down from 11.3 at admission.  Patient  had two stools checked on the 22nd where only one out of two was positive  for blood.  There was no overt bleeding.  With the patient being stable with  her hemoglobin seeming to have stabilized, laboratory is pending at time of  dictation.  She is thought to be ready for discharge home.   #2 - HYPOKALEMIA:  Patient is in the process of being evaluated for  abnormalities in the aldosterone __________ system as a cause of her  persistent hypokalemia which is of relatively new onset.  At this time she  will be sent home on a continued  high dose of potassium replacement.   The patient's other medical problems have been stable including her  hypertension, hypothyroid disease, COPD.  She will be able to resume all of  her home medications.   DISCHARGE EXAMINATION:  VITAL SIGNS:  Temperature was 97.9, blood pressure  130/70, pulse 65, respirations 18.  GENERAL APPEARANCE:  This is a heavy-set woman lying in bed, was in no acute  distress.  HEENT:  Unremarkable.  CHEST:  Clear.  CARDIOVASCULAR:  2+ radial pulse.  She had a regular rate and rhythm without  murmurs.  ABDOMEN:  Soft.  There was no guarding, no rebound.  There was no  organosplenomegaly.  EXTREMITIES:  Normal except for 1+ to 2+ lower extremity edema.   Final laboratory available at the time of dictation was a BMET from the 21st  with a potassium at 3, glucose 121, creatinine 1.3.  TSH was checked this  admission and came back at normal 2.541.  Magnesium was checked and came  back at normal at 2.   DISPOSITION:   Patient is discharged home.  She is to resume all of her home  medications including potassium 20 mEq t.i.d.  Will arrange for her to have  direct outpatient colonoscopy with Dr. Melvia Heaps in one to two weeks.   Patient's condition at time of discharge dictation is stable and improved  and ready for discharge home.           ______________________________  Rosalyn Gess. Norins, M.D. [facility]     MEN/MEDQ  D:  10/03/2005  T:  10/04/2005  Job:  [id number]   cc:   [place] D. Arlyce Dice, M.D. [facility]  [address]  [place]  [place] [address]

## 2010-10-29 NOTE — Procedures (Signed)
EEG NUMBER:  EEG [id number].   HISTORY:  This is a 77 year old patient who is being evaluated for  episodes of syncope 2 weeks ago.  The patient is also complaining of  some left-sided headaches over the last month.  This is a routine EEG.  No skull defects noted.   MEDICATIONS:  Are not listed.   ELECTROENCEPHALOGRAM CLASSIFICATION:  Dysrhythmia grade 2 left temporal.   DESCRIPTION OF RECORDING:  The background rhythm of this recording  consists of a fairly well modulated, medium amplitude, alpha rhythm of 9  Hz that is reactive to eye opening and closure.  As the record  progresses, the patient appears to remain in the awakened state during  the recording.  Photic stimulation is performed towards the end of the  recording, resulting in a minimal but bilateral photic drive response  seen.  Hyperventilation was not performed.  Particularly towards the end  of the recording, very minimal episodes of dysrhythmic theta activity  was seen emanating from the left mid temporal region.  At no time does  there appear to evidence of actual spike or spike wave discharges.  EKG  monitor shows no evidence of cardiac rhythm abnormalities with no  evidence of cardiac rhythm abnormalities seen.  No electrographic  seizures were recorded.   IMPRESSION:  This is a minimally abnormal electroencephalogram recording  due to some intermittent dysrhythmic theta activity emanating from the  left mid temporal area.  Such a recording suggests some focal left brain  abnormality/irritability.  The above recording suggests a lowered  seizure threshold but no electrographic seizures were recorded.      Marlan Palau, M.D.  Electronically Signed     ZOX:WRUE  D:  05/12/2006 11:23:39  T:  05/13/2006 12:15:26  Job #:  [id number]

## 2010-10-29 NOTE — Consult Note (Signed)
NAMECLARRISSA, [name]                ACCOUNT NO.:  [id number]   MEDICAL RECORD NO.:  [id number]          PATIENT TYPE:  INP   LOCATION:  5501                         FACILITY:  [facility]   PHYSICIAN:  Iva Boop, M.D. LHCDATE OF BIRTH:  1928-02-01   DATE OF CONSULTATION:  DATE OF DISCHARGE:  10/03/2005                                   CONSULTATION   PRIMARY CARE PHYSICIAN:  Rosalyn Gess. Norins, M.D. [facility]   REASON FOR CONSULTATION:  Bright red blood per rectum/GI bleeding.   ASSESSMENT:  1.  Lower gastrointestinal bleeding which is painless.  She does have some      abdominal tenderness on the right side which says started after medical      staff began to examine her.  2.  There is a history of an adenomatous colon polyp of the sigmoid colon      and diverticulosis on colonoscopy in May of 2002.  3.  Significant hypokalemia.  4.  Multiple other comorbidities as outlined below.  5.  The heme-negative stool on rectal exam is interesting and indicates a      possible hemorrhoidal bleed.   RECOMMENDATIONS AND PLAN:  1.  Supportive care , repelete potassium.  2.  Serial hemoglobin.  3.  Eventually she should have a colonoscopy once her fluids are      resuscitated as well as her potassium is repleted, and this will      probably be done as an inpatient.   HISTORY OF PRESENT ILLNESS:  This is a pleasant 77 year old white woman who  has been having trouble with hypokalemia recently.  She received some  colchicine and developed diarrhea, but this did help her gout, however.  She  was found to have a potassium of less than 2 recently and was given  outpatient regimen for therapy on that by Dr. Debby Bud.  She seemed to be  tolerating that okay but then yesterday started having bright red blood and  clots.  She had several of those and then had her last one today.  She says  she called the office yesterday but then received a call this morning and  was told to go to the emergency room.  Her  hemoglobin is 11.3.  There is no  more bleeding reported at this time.  She has no abdominal pain, except when  palpation of the abdomen occurs.  Her potassium is 2.6.  She does not have  food dysphagia, though she have pill dysphagia.  She had an esophageal  dilation performed by Dr. Melvia Heaps in July of 2006 most recently.  She  does have a stricture of the esophagus.   She denies any melena or nausea or vomiting.  Recently, she was given some  indomethacin for her gout after colchicine caused diarrhea.   MEDICATIONS:  1.  Potassium 20 mEq twice daily.  2.  Allopurinol 300 mg daily.  3.  Indomethacin 25 mg three times a day started on September 19, 2005.  4.  Acebutolol 200 mg twice daily.  5.  Norvasc 5 mg daily.  6.  Lasix 20 mg every morning.  7.  Metolazone 2.5 mg every a.m.  8.  Nitroglycerin 0.4 mg as needed.  9.  Levoxyl 112 mcg daily.  10. Fluoxetine 40 mg daily, 20 mg in the evening.  11. Alprazolam 0.5 mg b.i.d.  12. Lipitor 20 mg every evening.  13. Omeprazole 20 mg daily.  14. Metoclopramide 5 mg before meals.  15. Fludrocortisone 0.1 mg every morning.  16. Hyoscyamine 0.125 mg four times a day.  17. Hydrocodone/APAP 10/325 mg p.r.n.  18. Mucinex two tablets a.m. and p.m.  19. Quinine sulfate 325 mg b.i.d.  20. It looks like her omeprazole was changed to Prevacid 30 mg daily.  21. Indomethacin 25 mg.   PAST MEDICAL HISTORY:  1.  COPD.  2.  Obesity.  3.  Dyslipidemia.  4.  Reflux disease.  5.  Gout.  6.  Coronary artery disease.  Note that the coronary artery disease is      nonobstructive.  7.  Diastolic dysfunction.  8.  Breast cancer.  9.  Hypothyroidism.  10. History of prior spinal stenosis.  11. Chronic lower extremity edema problems.  12. Surgery for a herniated disk and spinal stenosis.  13. History of depression with ongoing depression treatment and anxiety      treatment.  14. Mild pulmonary hypertension based upon 2005 cardiac catheterization.   15. Prior right pubic fracture and multiple rib fractures.   REVIEW OF SYSTEMS:  She has been lightheaded and weak.  She has fallen a few  times around the house.  She uses a walker to ambulate.  She has eyeglasses.  She denies any chest pain.  She is a little bit dyspneic, though that is  chronic.  She has edema of the lower extremities.  Her gout is significantly  improved.   All other systems appear negative at this time, other than generalized  weakness.   SOCIAL HISTORY:  Here with her daughter and husband.  She is retired.  No  alcohol or tobacco.   FAMILY HISTORY:  Positive for stroke and breast cancer.   ALLERGIES:  SULFA AND NOVOCAIN.   PHYSICAL EXAMINATION:  GENERAL:  Morbidly obese elderly white woman.  VITAL SIGNS:  Blood pressure 100/57, pulse 72 and regular, respirations 16,  temperature 97.1,  HEENT:  The eyes are anicteric.  Mouth:  Posterior pharynx free of lesions.  NECK:  Supple without mass, thyromegaly.  CHEST:  Clear, except for some crackles at the base.  HEART:  S1 and S2.  I hear no rubs, murmurs, or gallops.  ABDOMEN:  Obese, soft.  There is mild right lower quadrant tenderness  without organomegaly or mass.  There is a little bit of fullness in that  area perhaps.  RECTAL:  Exam has shown some large external hemorrhoids per Dr. Olegario Messier notes.  She actually had Hemoccult-negative brown stool.  EXTREMITIES:  Trace peripheral edema.  There is no active gout in the left  foot anymore.  Warm and dry.  SKIN:  Slightly pale.   LABORATORY DATA:  As above.   I appreciate the opportunity to care for this patient.      Iva Boop, M.D. [facility]  Electronically Signed     CEG/MEDQ  D:  09/30/2005  T:  10/03/2005  Job:  [id number]   cc:   Rosalyn Gess. Norins, M.D. [facility]  [address]  [place]  [place] [address]

## 2010-10-29 NOTE — Letter (Signed)
May 22, 2006    [facility]  [address], [place]   RE:  [name], [name]  MRN:  [id number]  /  DOB:  1927-07-03   Dear colleagues:   Thank you very much for seeing and evaluating [name] for  recurrent, severe, left-sided headache with visual change.  In addition  to the patient being seen for abnormal EEG with question of a seizure  focus.   [name] was recently hospitalized at [facility] when she had what  appeared to be a syncopal episode at home.  She was ruled out for  cardiac disease.  She has a history of neurocardiogenic syncope with a  history of positive tilt table in the past, but this was different  presentation.  Please see the e-chart dictation from April 26, 2006  summarizing her most recent hospitalization.   As part of the patient's evaluation she had an outpatient EEG performed  May 12, 2006 which was read out as minimally abnormal due to some  intermittent dysrhythmic theta activity emanating from the left mid  temporal region.  This suggested focal left brain abnormality or  irritability with a lower seizure threshhold, although there was no  electrographic seizure recorded.   PAST MEDICAL HISTORY:  This patient's past medical history is quite  complex.  This includes COPD, hyperlipidemia, GERD, coronary artery  disease, with a question of MI x2 in the past.  History of spinal  stenosis and herniated disk which required surgery by Dr. Marrianne Mood.  She has history of breast cancer, history of depression, and  history of hypothyroid disease.   CURRENT MEDICATIONS:  1. Benadryl 50 mg q.h.s.  2. Potassium 20 mEq t.i.d.  3. fludracortisone(florinef) 0.1 mg q.a.m. for her orthostatic      hypotension/syncope.  4. Clarinex 5 mg daily.  5. Levoxyl 112 mcg daily.  6. Fluoxetine 20 mg daily.  7. Furosemide 20 mg daily.  8. Alprazolam 0.5 mg q.h.s.  9. Lipitor 20 mg daily.  10.Acid butalol 200 mg  b.i.d.  11.Aspirin 325 mg daily.  12.Sublingual nitroglycerin on a p.r.n. basis.   The patient is seen in the office today to review her EEG and due to her  lower seizure threshold and recent syncopal episode, I am starting her  on Dilantin at 100 mg daily x3 and advancing every three days to a final  dose of 300 mg q.h.s.   The patient's chart was reviewed and she has not had brain imaging.  I  at this time am not going to order any imaging studies until you see her  in consultation.   I appreciate your assistance in the evaluation of this nice patient and  look forward to hearing from you.   If I can provide any additional information, please do not hesitate to  contact me.  I remain yours truly,    Sincerely,      Rosalyn Gess. Norins, MD  Electronically Signed    MEN/MedQ  DD: 05/22/2006  DT: 05/22/2006  Job #: [id number]
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
## 2010-10-29 NOTE — Assessment & Plan Note (Signed)
[facility]                           PRIMARY CARE OFFICE NOTE   DEVAEH, AMADI                       MRN:          [id number]  DATE:09/18/2006                            DOB:          [birth year]    Ms. Jolley called today reported that she felt bad and was too sick to  come to the office.  She reports having headache, runny nose,  significant cough, wheezing, fever to 104.  She has been taking Tylenol  cold medicine, Delsym, and Mucinex.   Based on the report of her symptoms, I prescribed for her Phenergan with  Codeine 1 teaspoon every 6 hours as needed, doxycycline 100 mg b.i.d.  for 7 days.  She is to continue with Tylenol cold medication and  Mucinex.  She is to notify the office is she gets significantly worse.     Rosalyn Gess Norins, MD  Electronically Signed    MEN/MedQ  DD: 09/18/2006  DT: 09/18/2006  Job #: [id number]
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
## 2010-10-29 NOTE — Discharge Summary (Signed)
[place]. [facility]  Patient:    [name], [name]                       MRN: [id number] Adm. Date:  [id number] Disc. Date: [id number] Attending:  Corwin Levins                           Discharge Summary  DISCHARGE DIAGNOSES: 1. Probable viral illness. 2. Anxiety. 3. Hypertension. 4. Hypothyroidism. 5. Anxiety/depression. 6. History of neurogenic syncope. 7. History of breast cancer. 8. History of gastroesophageal reflux disease.  PROCEDURES:  None.  CONSULTATIONS:  None.  ADMISSION HISTORY AND PHYSICAL:  Please see that dictated on day of admission.  HOSPITAL COURSE:  [name] was a 77 year old black female who presented with severe anxiety and a difficult history because of that, but seemed to me primarily history focusing on headache, vomiting, chills, shakes, and decreased p.o. intake with possible exacerbation of orthostatic symptoms.  She had seen Dr. Debby Bud in the office who was concerned about TIA-like symptoms. She did have a head CT which was negative, had no focal neurological symptoms. She remained stable with respect to neurological symptoms over the next day, and in fact her gastrointestinal vomiting, shakes, and nausea have all resolved.  She remained afebrile with good p.o. intake.  There was a slight headache which was different from her migraine and decreased with Tylenol. Hemoglobin was 10.9 after initial 12.1 after dilution.  She was on heparin for a brief period after the head CT was negative, as she was felt to have no evidence for TIA and was felt to be at a relatively low risk for discontinuation and discharged home.  DISPOSITION:  Discharged to home in good condition.  DISCHARGE INSTRUCTIONS:  There are no activity or dietary restrictions.  DISCHARGE MEDICATIONS:  Will be the same as on admission including Florinef, hydrochlorothiazide, Levoxyl, Sectral, Xanax, Prevacid, Norvasc, Lipitor, and Serevent Diskus.  FOLLOWUP:   She will follow up with her primary care M.D. on a p.r.n. basis. DD:  04/14/00 TD:  04/15/00 Job: [id number]

## 2010-10-29 NOTE — Cardiovascular Report (Signed)
NAMEALEXSA, FLAUM                ACCOUNT NO.:  [id number]   MEDICAL RECORD NO.:  [id number]          PATIENT TYPE:  INP   LOCATION:  3310                         FACILITY:  [facility]   PHYSICIAN:  Vida Roller, M.D.   DATE OF BIRTH:  04-11-1928   DATE OF PROCEDURE:  DATE OF DISCHARGE:                              CARDIAC CATHETERIZATION   PRIMARY CARE PHYSICIAN:  Rosalyn Gess. Norins, M.D.   CARDIOLOGIST:  Charlton Haws, M.D.   HISTORY OF PRESENT ILLNESS:  Mrs. Obyrne is a 77 year old woman with  hypertension and nonobstructive coronary disease on a catheterization done  in 2000, who presents with significant dyspnea on exertion.  She was risk  stratified and felt to be appropriate for a heart catheterization.   PROCEDURES PERFORMED:  1.  Left heart catheterization.  2.  Right heart catheterization.  3.  Selective coronary angiography.  4.  Left ventriculography.  5.  Distal aortography.   DETAILS OF THE PROCEDURE:  After obtaining informed consent, the patient was  brought to the cardiac catheterization laboratory in the fasting state.  There she was prepped and draped in the usual sterile manner.  Local  anesthetic was obtained over the right groin using 1% lidocaine with  epinephrine.  The right femoral and right femoral vein were both cannulated  using the modified Seldinger technique, the vein with an 8 French 10 cm  sheath and the artery with a 6 French 10 cm sheath.  A right heart  catheterization was performed using a flow directed balloon tipped Swan-Ganz  catheter which was placed into the pulmonary capillary wedge position where  tracings were obtained.  The catheter was then repositioned in the left  pulmonary artery and the pigtail catheter was inserted into the left  ventricle.  Simultaneous pressures were obtained.  The right catheter was  positioned into the pulmonary capillary wedge position and simultaneous  pressures were obtained again.  Then the right catheter  was moved in a  retrograde manner through the pulmonary artery, right ventricle and right  atrium, each place obtaining adequate hemodynamic tracings.  The left  pigtail catheter was then used for a left ventriculography which was imaged  in the 30 degree RAO view.  Then the catheter was removed.  Left heart  catheterization was performed using a 6 [place] Judkin's right #4 and a 6  [place] Judkin's left #4.  At the conclusion of the coronary angiogram, the  pigtail catheter was once again inserted, this time into the distal aorta  where aortography was performed in the AP view.  At the conclusion of the  procedure, the catheters were removed.  The patient was moved back to the  holding area where the femoral artery sheath and femoral venous sheaths were  removed.  Hemostasis was obtained using direct manual pressure.  At the  conclusion of the hold, there was no evidence of ecchymosis or hematoma  formation.  Distal pulses were intact.  Total fluoroscopic time 7.4 minutes.  Total ionized contrast 150.   RESULTS:  Pressures:  Right atrial pressure A wave of 18, V wave of  17 and  mean of 16 mmHg.   RV pressure 44/12 with a mean of 15 mmHg.   Pulmonary artery pressure 38/17 with a mean of 27 mmHg.   Pulmonary capillary wedge pressure A wave of 20, V wave of 17 and mean of 15  mmHg.   Aortic pressure 163/77 with a mean of 121 mmHg.   Left ventricular pressure 175/13 with an end-diastolic pressure of 31 mmHg.   Cardiac output by the thermodilution method 7.6 with an index of 4.0 and by  the thick method 3.3 with an index of 1.7.  Simultaneous LV pulmonary  capillary wedge pressure did not show a significant gradient contrary to the  numbers obtained with the isolated catheters and there was no significant  hemodynamic gradient on aortic valvular pullback.   Coronary anatomy:  The right coronary artery is a large dominant vessel with  a 50% stenosis in its ostium.  The posterior  descending coronary artery is a  small artery which is hemodynamically unremarkable.   The left main coronary artery is a large vessel which is hemodynamically  unremarkable.  The left anterior descending coronary artery has a 25%  stenosis in its mid portion and a 20% stenosis in its distal portion.  There  is a large first diagonal which bifurcates and is free of disease.  The  circumflex coronary artery has a small posterolateral and a small obtuse  marginal and is also free of disease.   Distal aortography reveals nonobstructive disease in the renal arteries with  no significant dilatation and only minimal atherosclerotic disease distally.   The left ventriculogram reveals a preserved ejection fraction of 60% with no  mitral regurgitation and no wall motion abnormalities seen.   ASSESSMENT:  1.  Normal right heart pressures with just slightly elevated RV pressures      and PA pressures which are consistent with very mild pulmonary      hypertension.  2.  Normal left ventricular systolic function with elevated EDP consistent      with diastolic dysfunction.  3.  Nonobstructive coronary disease.  4.  Patent renal arteries.   PLAN:  Medical therapy for her hypertension.  I think it would be reasonable  to obtain an echocardiogram to ensure that her mitral valve is indeed  without significant gradient, though my index suspicion is relatively low.      Trey Paula   JH/MEDQ  D:  04/15/2004  T:  04/15/2004  Job:  [id number]
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
## 2010-10-29 NOTE — H&P (Signed)
[name], [name]                          ACCOUNT NO.:  [id number]   MEDICAL RECORD NO.:  [id number]                   PATIENT TYPE:  INP   LOCATION:  3734                                 FACILITY:  [facility]   PHYSICIAN:  Sean A. Everardo All, M.D. [facility]           DATE OF BIRTH:  November 18, 1927   DATE OF ADMISSION:  11/12/2003  DATE OF DISCHARGE:                                HISTORY & PHYSICAL   REASON FOR ADMISSION:  Fall.   HISTORY OF PRESENT ILLNESS:  The patient is a 77 year old woman who was  walking at home today and miss-stepped with her right leg and had sudden and  momentary right lower extremity weakness.  She fell to the ground on her  right hip and had the onset of associated moderate to severe right hip pain.  She also states right-sided chest pain off and on for the past few hours.  In retrospect, she states she has chronic intermittent chest pain.   PAST MEDICAL HISTORY:  1. Dyslipidemia.  2. Depression/anxiety.  3. Hypertension.  4. GERD.  5. Osteoarthritis of the lumbar spine.  6. Admitted in 2004 for chest pain and had a normal pharmacologic Cardiolite     study.  7. Hypothyroidism.  8. COPD.   MEDICATIONS:  1. Maxzide 37.5/25 mg 1 daily.  2. Xanax 0.5 mg twice daily.  3. Prozac 40 mg daily.  4. Lipitor 20 mg daily.  5. Norvasc 5 mg daily.  6. Wellbutrin XL 300 mg daily.  7. Prevacid 30 mg daily.  8. Sectral 200 mg twice a day.  9. Levoxyl 100 mcg daily.  10.      Advair 250/50 one puff twice daily.  11.      Aspirin 325 mg daily.   SOCIAL HISTORY:  The patient is retired.  She is separated.  She is here  with her daughter.   FAMILY HISTORY:  Positive for osteoporosis in mother and several siblings.   REVIEW OF SYSTEMS:  Denies the following, numbness, double vision, hearing  loss, nausea, vomiting, fever, shortness of breath, headache, loss of  consciousness, seizure, hematuria, and rectal bleeding.   PHYSICAL EXAMINATION:  VITAL SIGNS:  Blood pressure  152/116, heart rate 67,  respiratory rate 20, patient afebrile.  GENERAL:  Minimal distress secondary to pain.  SKIN:  Not diaphoretic.  HEENT:  Head is atraumatic.  Sclerae nonicteric.  Pharynx clear.  NECK:  Supple.  CHEST:  Clear to auscultation.  Chest wall is nontender.  CARDIOVASCULAR:  No JVD, no edema.  Regular rate and rhythm.  No murmur.  Pedal pulses are intact bilaterally.  ABDOMEN: Soft, obese, nontender.  No hepatosplenomegaly, no mass.  BREASTS/GYNECOLOGIC/RECTAL:  Examinations not done at this time due to  patient condition.  EXTREMITIES:  She moves all fours, but movement of the right lower extremity  is severely limited by pain.  There is tenderness about the right hip.  NEUROLOGIC:  Alert, well oriented, does not appear anxious nor depressed.  Cranial nerves appear to be intact, and sensation is diffusely intact to  touch.   LABORATORY AND X-RAY DATA:  Laboratory studies remarkable for potassium 3.3.  WBC 14,300.   X-ray shows fracture of the right pubic ramus.   IMPRESSION:  1. Pelvic fracture.  2. Chest pain.  3. Hypokalemia.  4. Situational hypertension.   PLAN:  1. Pain medication.  2. Check CPKs.  3. I discussed code status with the patient, and she requests DNR.  4. Replete potassium and recheck.                                                Sean A. Everardo All, M.D. [facility]    SAE/MEDQ  D:  11/12/2003  T:  11/13/2003  Job:  [id number]   cc:   Rosalyn Gess. Norins, M.D. [facility]
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
## 2010-10-29 NOTE — Discharge Summary (Signed)
[name], [name]                          ACCOUNT NO.:  [id number]   MEDICAL RECORD NO.:  [id number]                   PATIENT TYPE:  INP   LOCATION:  5012                                 FACILITY:  [facility]   PHYSICIAN:  Rene Paci, M.D. [facility]          DATE OF BIRTH:  04/10/1928   DATE OF ADMISSION:  11/12/2003  DATE OF DISCHARGE:  11/17/2003                                 DISCHARGE SUMMARY   DISCHARGE DIAGNOSES:  1. Traumatic fall with nondisplaced right pubic fracture and multiple left     rib fractures, on narcotics with pain overall controlled, stable for home     health therapy.  2. Chest pain secondary to rib fracture, CT negative for pulmonary embolism,     adenosine Cardiolite negative for ischemia in 2004.  3. History of chronic obstructive pulmonary disease.  4. Hypokalemia, likely secondary to diuretic, now on replacement,     normalized.  5. Dyslipidemia.  6. Gastroesophageal reflux disease.  7. Hypothyroidism.  8. Depression/anxiety.   DISCHARGE MEDICATIONS:  1. OxyContin 10 mg p.o. b.i.d. plus p.r.n. Vicodin 1-2 p.o. q.4h. p.r.n.     breakthrough pain.  2. Colace 100 mg p.o. q.h.s.  3. K-Dur 20 mEq p.o. q.d.  4. Other medications are as prior to admission and include:  5. Norvasc 5 mg p.o. q.d.  6. Lipitor 20 mg p.o. q.d.  7. Levoxyl 100 mg p.o. q.d.  8. Prevacid 30 mg p.o. q.d.  9. Advair 250/50 b.i.d.  10.      Aspirin 325 mg p.o. q.d.  11.      Sectral 200 mg p.o. b.i.d.  12.      Maxzide 37.5/25 1 p.o. q.d.  13.      Xanax 0.5 mg p.o. b.i.d.  14.      Wellbutrin XL 300 mg p.o. q.d.  15.      Prozac 40 mg p.o. q.d.   Hospital followup is to be arranged by patient with her primary care  physician, Dr. Rosalyn Gess. Norins, M.D. [facility], within the next two or three  weeks to evaluate outpatient progress.  She will also been seen by [facility] for therapy to continue her rehabilitation at home.  She will be  living at her daughter's home until  she is able to return to her own  independent living apartment.  No restrictions on activity.  Stable at time  of discharge.   BRIEF HOSPITAL COURSE BY PROBLEM:  1. Traumatic fall.  The patient is a pleasant 77 year old woman who     misstepped at home and stumbled and fell against her right side.  Because     of pain and inability to walk, she was brought to the emergency room,     where she was diagnosed with a nondisplaced right pubic rami fracture.     She was also complaining of chest pain, and a D-dimer was markedly  elevated at 3, thus she underwent a CT of the chest that was negative for     PE.  On further physical exam, it was felt that these were likely     traumatic injuries, and a rib detail, in fact, confirmed left rib     fractures, five, six and seven, acute, resulting from recent injury, also     causing pelvic fracture.  She was begun on OxyContin low-dose, 10 mg     twice daily with p.r.n. Vicodin.  Her pain is overall controlled with     this regimen except with occasional coughing and exacerbated movement to     and from bed to chair.  She was evaluated by PT/OT and felt that because     of her marked speed in recovering and level of independence with her     activities of ADLs, that she was a suitable candidate for home health     therapy rather than short-term facility.  As such, this has been     arranged.  Patient will be going to her daughter's home to receive home     care until she is able to return to her own independent living facility.     She was begun on Colace in addition to the above-listed medications to     prevent narcotic-induced constipation.  2. Hypokalemia.  Patient had incidental low potassium, likely related to     hydrochlorothiazide component of Maxzide on admission.  She was replaced     orally, and low-dose K-Dur has been admitted to her list.  Last potassium     at time of discharge was 4.1 on June 5.  All other medications are as      prior to admission.  No other changes were made in her regimen.                                                Rene Paci, M.D. [facility]    VL/MEDQ  D:  11/17/2003  T:  11/18/2003  Job:  [id number]

## 2010-10-29 NOTE — Op Note (Signed)
[place]  Patient:    [name], [name]                       MRN: [id number] Adm. Date:  [id number] Attending:  Evonnie Dawes                           Operative Report  PREOPERATIVE DIAGNOSES: 1. Spinal stenosis, L4-5, and spinal stenosis plus herniated nucleus pulposus,    L5-S1, bilaterally. 2. Harvesting of posterior elements for later fusion. 3. Repair of cerebrospinal fluid leak, complex. 4. Right posterolateral fusion using autograft mixed with blood and Vitoss    bone scaffolding.  SURGEON:  Ricky D. Gasper Sells, M.D.  ASSISTANT:  Annie Sable, Montez Hageman., M.D.  ANESTHESIA:  General, controlled.  INSTRUMENT COUNT, SPONGE COUNT, NEEDLE COUNT:  Correct.  ESTIMATED BLOOD LOSS BY ANESTHESIA:  About 1000.  Intraoperative hemoglobin 9.6.  Vital signs stable throughout.  INDICATIONS:  This is a 77 year old female with a bad cardiac history on whom I performed a remote lumbar laminectomy on.  This is at the L4-5 level.  She has a bad cardiac history and most recently had an MRI in December 2000.  We sent her to cardiology where she had a Cardiolite and was cleared for surgery just a few days ago.  She had severe disabling low back pain and left initially greater than right leg pain switching to right greater than left leg pain.  An MRI showed that she had spinal stenosis at L4-5 and also spinal stenosis at L5-S1 with an HNP eccentric to the left at that time.  Today, however, her pain is mainly in her right leg rather than the left so it has shifted.  The bulge has seemed to impinge both nerve roots anyway.  It was elected to do a bilateral L4-5 and L5-S1 decompression, then take the disk out at L5-S1.  This was carried out today.  The only problem was, when doing the diskectomy, a large venous lake was encountered and there was quite a bit of blood loss at that time.  Her vital signs remained stable.  She awoke from surgery  unremarkably, moving all four extremities, and with less leg pain.  There was a rent in the midline, really the inferior rim of L5.  This was closed with a figure-of-eight suture with Surgicel over that without leaks to Valsalvas of 30 ______ meters of water x 2.  DESCRIPTION OF PROCEDURE:  With the patient in the prone position, the patient was prepped and draped in the usual fashion for a lumbar laminectomy with all pressure points carefully inspected and padded PS hose in place and bear hugger blanket in place.  A midline incision was made.  The incision was so avascular that scar incision was done to get down to viable tissue.  There was basically very little paraspinal muscles.  It was all scar tissue and fat.  Numerous x-rays were done intraoperatively to confirm the appropriate operative levels which were L4-5 and L5-S1.  The laminas had completely overgrown themselves almost. There is a moderate amount of movement at the L4-5 level, grabbing the spinous processes and shaking them.  Staying in the midline, finally dura was encountered and a laminectomy of L5 was performed in the midline extending down to S1.  There was a partial thickness dural tear at that level with minimal CSF leakage.  This was closed with one  interrupted 6-0 silk suture in a figure-of-eight fashion with Surgicel oversewing the site.  A nice decompression of L4-5 was achieved with decompression of the L5 nerve roots bilaterally.  At the L5-S1 level starting out on the left side, the disk was identified. There was a large venous lake overlying it.  Coagulating the lake made the bleeding worse.  Packing it off was practically impossible.  This necessitated taking some blood loss while the diskectomy was performed.  This was performed with straight-biting, up-biting, and down-biting pituitary rongeurs and medium Sissy Hoff curet to curet the end plates and an Epstein instrument to curet subligamentously.   Exact same procedure was carried out on the right side.  Bone had been saved for later fusion.  Because there was some hypermobility at L4-5, the L4, L5, and sacral ala of S1 on the right side were roughened up, and the patients own bone mixed with Vitoss was packed into that site.  It was held in place with Surgicel.  The wound was irrigated with Betadine solution and hydrogen peroxide.  A further Valsalva was performed.  There was no leakage.  So, the wound was closed with 2-0 Vicryl in the scar tissue/lumbar fascia and then inverted interrupted and the skin staples were used in the skin.  Marcaine was used to anesthetize the skin edge.  Patient tolerated the procedure well and awoke from the surgery unremarkably. DD:  01/31/00 TD:  01/31/00 Job: [id number]

## 2010-10-29 NOTE — H&P (Signed)
[name], [name]                ACCOUNT NO.:  [id number]   MEDICAL RECORD NO.:  [id number]          PATIENT TYPE:  INP   LOCATION:  1825                         FACILITY:  [facility]   PHYSICIAN:  Tammy Rexene Edison. Swords, MD    DATE OF BIRTH:  10-19-1927   DATE OF ADMISSION:  04/13/2004  DATE OF DISCHARGE:                                HISTORY & PHYSICAL   CHIEF COMPLAINT:  Shortness of breath.   HISTORY OF PRESENT ILLNESS:  [name] is a chronically ill 77 year old  female with a 3-week history of intermittent shortness of breath.  She  states during that time she has developed a productive cough, wheezing,  intermittent shortness of breath at rest.  She has chronic dyspnea on  exertion.  She has seen Dr. Debby [name] and her pulmonary doctor (she refers to  that as Dr. Jens [name]) multiple times during the 3-week period.  She has not  improved.  She states today she started feeling significantly worse late in  the evening.  Apparently, neighbors heard her moaning, and she came to the  emergency department for further evaluation.  She is feeling better since  coming to the emergency department.   PAST MEDICAL HISTORY:  1. COPD.  2. Hyperlipidemia.  3. Gastroesophageal reflux disease.  4. There is documentation of coronary artery disease in previous records,      but I do not see an angiogram.  There is a myocardial perfusion scan      that demonstrates reversibility.  5. She has a history of spinal stenosis and a herniated disk, which      required surgery.  6. She has a history of breast cancer.  7. History of depression.  8. History of hypothyroidism.   CURRENT MEDICATIONS:  She does not remember any of the medications, and  refers me to a discharge summary in June.  She states the changes from that  discharge summary to now is that she is off the Advair.  She is on a metered  dose inhaler (she does not know the name), and she is also on Claritin.  She  states her blood pressure  medications are the same, and now she is on  Relafen, but she does not know the dose.   FAMILY HISTORY:  Noncontributory.   SOCIAL HISTORY:  She is an ex-smoker.  She lives alone in a retirement  center.   REVIEW OF SYSTEMS:  She complains of chronic back and rib pain.  She  complains of chronic dyspnea on exertion.  She complains of intermittent  chest pain, but that is worse since her rib fractures in June.  She denies  any other complaints in the review of systems, and a full review of systems  was performed.   PHYSICAL EXAMINATION:  VITAL SIGNS:  Temperature 98, current pulse oximetry  100%, respiratory rate 22, blood pressure 136/70.  GENERAL:  She appears as a chronically ill female in moderate respiratory  distress.  She does have BiPAP on.  HEENT:  Atraumatic and normocephalic.  Extraocular muscles are intact.  NECK:  Supple without lymphadenopathy,  thyromegaly, or jugular venous  distention.  Carotid upstrokes are normal.  I am unable to hear bruits.  CHEST:  There are decreased breath sounds throughout with prolonged  expiratory time.  No obvious wheezing.  CARDIAC:  PMI is not palpable.  S1 and S2 reveal distant heart sounds.  There is no significant gallop.  No murmur is heard.  ABDOMEN:  Obese.  Active bowel sounds.  Soft, nontender.  There is no  hepatosplenomegaly.  No masses are palpated.  EXTREMITIES:  There is 1 to 2+ edema bilaterally.  No cyanosis is noted.  No  clubbing is noted.  Peripheral pulses are palpated.  DERMATOLOGIC:  No rashes.  NEUROLOGIC:  She is alert and oriented and moves all 4 extremities without  difficulty.   LABORATORY DATA:  BNP normal at 50.8.  White count normal at 9.3, hemoglobin  12.2, platelet count 268,000.  D-dimer is normal at 0.26.  CMET normal,  except for a potassium of 3.3, total protein 5.9.  Arterial blood gases  apparently done.  The only thing on the respiratory note that is documented  is a PO2 of over 500.  Her cardiac  markers were normal in the emergency  department.  An EKG done in the emergency department is of poor quality.  It  seems to be sinus rhythm with a regular rate.   Chest x-ray is pending.   ASSESSMENT AND PLAN:  1. Chronic obstructive pulmonary disease exacerbation requiring BiPAP.      Will continue BiPAP.  I think she needs aggressive care.  Treat for      chronic obstructive pulmonary disease exacerbation with nebulizers,      steroids, oxygen (BiPAP).  Will rule out for myocardial infarction.  I      think she is sick enough that she needs a step-down unit.  She does      have documented respiratory failure on this hospitalization.  2. History of coronary artery disease.  This is unclear in my mind, but      will rule out for myocardial infarction with enzymes, and will get an      EKG in the morning.  3. Other medical problems listed above appear to be stable, and will      continue her home medications as listed in June of 2005.  I have asked      her daughter to bring in a complete list of medications.      Tammy Rexene Edison Swords, MD  Electronically Signed     BHS/MEDQ  D:  04/13/2004  T:  04/13/2004  Job:  [id number]   cc:   Rosalyn Gess. Norins, MD  Madolyn Frieze. Tammy Som, MD,FACC
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
## 2010-10-29 NOTE — Discharge Summary (Signed)
NAMELOWEN, MANSOURI                ACCOUNT NO.:  [id number]   MEDICAL RECORD NO.:  [id number]          PATIENT TYPE:  INP   LOCATION:  3731                         FACILITY:  [facility]   PHYSICIAN:  Corwin Levins, M.D. LHCDATE OF BIRTH:  1928/01/24   DATE OF ADMISSION:  04/13/2004  DATE OF DISCHARGE:  04/16/2004                                 DISCHARGE SUMMARY   DISCHARGE DIAGNOSES:  1.  Chronic obstructive pulmonary disease exacerbation.  2.  Hyperlipidemia.  3.  Gastroesophageal reflux disease.  4.  Hypothyroidism.  5.  Anxiety/depression.  6.  History of breast cancer.  7.  History of spinal stenosis.  8.  History of coronary artery disease.  9.  History of right pubic fracture and multiple rib fractures.  10. Diastolic dysfunction by echocardiogram.   PROCEDURES:  1.  Cardiac catheterization, April 15, 2004, with non-obstructive coronary      artery disease, ejection fraction 60%, elevated end-diastolic pressure      consistent with diastolic dysfunction, patent renal arteries.  2.  Echocardiogram, April 16, 2003, with valvular abnormalities, normal      ejection fraction.   CONSULTATIONS:  Cardiology.   HISTORY AND PHYSICAL:  See that dictated the day of admission, April 13, 2004.   HOSPITAL COURSE:  Ms. Selders is a very nice 77 year old white female who  presented with acute respiratory insufficiency consistent with what was felt  to be initially a COPD exacerbation, questionable early left lower lobe,  treated in the usual fashion with IV antibiotics, IV steroids, nebulizer  treatment, O2, pulmonary toilet.  She responded quite nicely and very  rapidly, but had some residual dyspnea.  There were mildly abnormal cardiac  enzymes, cardiac catheterization, April 15, 2004, and echocardiogram with  results as above.  Subsequent PFTs the morning of discharge were consistent  with mild obstructive defect only.  Medications were adjusted with respect  to anxiety and  depression.  As she was doing well, ambulatory, eating well,  no further symptoms and evaluated completed, it was felt she had gained  maximum benefit from this hospitalization and is to be discharged home.  It  should be noted that there was a slightly low TSH of 0.313 felt to be  possibly sick euthyroid, but unclear, repeat TFTs pending at time of this  dictation at the time of discharge.   DISPOSITION:  Discharged to home in good condition.   DIET AND ACTIVITY:  No activity or dietary restrictions.   SPECIAL DISCHARGE INSTRUCTIONS:  As she was borderline-hypoxemic, she is to  be discharged home on 2 L to be arranged through home health care.   FOLLOWUP:  She is to follow up with Dr. Rosalyn Gess. Norins in 1-2 weeks for  followup of general issues as well as thyroid testing.   DISCHARGE MEDICATIONS:  1.  Colace 100 mg nightly.  2.  Norvasc 5 mg p.o. daily.  3.  Lipitor 20 mg daily.  4.  Levoxyl 0.1 mg p.o. daily.  5.  Ecotrin 325 mg p.o. daily.  6.  Lasix 40 mg p.o. daily.  7.  Paxil 20 mg p.o. daily.  8.  Albuterol metered-dose inhaler p.r.n.  9.  Avapro 150 mg p.o. daily.  10. Protonix 40 mg p.o. daily.  11. Xanax 0.25 mg b.i.d. p.r.n.  12. Predinsone weaning dose starting at 40 mg for the next 3 days, then      weaning off thereafter over 10 days.       JWJ/MEDQ  D:  04/16/2004  T:  04/17/2004  Job:  [id number]

## 2010-10-29 NOTE — H&P (Signed)
[name], [name]                ACCOUNT NO.:  [id number]   MEDICAL RECORD NO.:  [id number]          PATIENT TYPE:  EMS   LOCATION:  [facility]                         FACILITY:  [facility]   PHYSICIAN:  Thomos Lemons, D.O. [facility]   DATE OF BIRTH:  Sep 11, 1927   DATE OF ADMISSION:  09/30/2005  DATE OF DISCHARGE:                                HISTORY & PHYSICAL   CHIEF COMPLAINT:  Bloody stools.   HISTORY OF PRESENT ILLNESS:  The patient is a 77 year old white female with  a history of recent loose stools, gout, COPD, who presents with bright red  blood per rectum x 2 days.  The patient states that she had her first bloody  bowel movement where she noticed large clots and a large amount of blood in  the commode.  The patient subsequently had two further episodes with  reduced amounts of blood over the next two days.  The patient reports  feeling dizzy and light headed.  The daughter notes that the patient almost  passed out on two occasions.   Approximately two weeks ago, the patient recalled she may have passed dark  stools.  She denies sticky character, no other previous episodes of dark  stools and she denies any chronic NSAID use, has been taking Tylenol for  pain.  She has had some issues with lower extremity gout and also  hypokalemia recently, she was placed on Colchicine which caused severe  diarrhea.  In addition, her potassium has been as low as 1.8, has been  repleted as an outpatient over the last two week time period with difficulty  raising her potassium level.   She does not report any recent antibiotic use.  Other review of systems, she  does not report any chest pain, shortness of breath, abdominal pain.  All  other systems negative.   PAST MEDICAL HISTORY:  1.  Chronic obstructive lung disease on O2.  2.  Hyperlipidemia.  3.  Gastroesophageal reflux disease.  4.  Nonobstructive coronary artery disease with report of diastolic      dysfunction.  5.  History of breast  cancer.  6.  Hypothyroidism.  7.  History of spinal stenosis status post surgery x 5.  8.  History of lower extremity edema.  9.  Gout.   SOCIAL HISTORY:  The patient is a non-smoker, non-drinker.  She is separated  from her husband but lives in the same household.  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  She has six  children and is accompanied by her daughter.   FAMILY HISTORY:  Positive for CVA and breast cancer.   CURRENT MEDICATIONS:  1.  Acebutolol 200 mg b.i.d.  2.  Zaroxolyn 2.5 mg at night.  3.  Allopurinol 300 mg once daily.  4.  Fludrocortisone 0.1 mg q.a.m.  5.  Potassium chloride 20 mEq one tab t.i.d.  6.  Fluoxetine 2 tabs in the a.m., one in p.m., 20 mg.  7.  Prevacid 30 mg daily.  8.  Levoxyl 112 mcg daily.  9.  Lipitor  20 mg daily.  10. Lasix 20 mg q.a.m.  11. Clarinex 5 mg once daily.  12. Alprazolam 0.5 mg t.i.d. p.r.n.   ALLERGIES:  SULFA AND NOVOCAIN.   LABORATORY DATA:  Sodium 141, potassium 2.6, chloride 98, CO2 34, BUN 33,  serum creatinine 1.8, blood sugar 122.  pH 7.553, pCO2 38.6.  CBC showed WBC  9.4, hemoglobin 11.3, hematocrit 33.1, platelets 274.   PHYSICAL EXAMINATION:  VITAL SIGNS:  Temperature 97.1, blood pressure 126/79, pulse 71,  respirations 20, saturation 96% on room air.  GENERAL:  The patient is a pleasant 77 year old white female, awake, alert,  in no apparent distress.  HEENT:  Normocephalic, atraumatic, pupils equal and reactive to light  bilaterally, extraocular movements intact, the patient had mild pale  conjunctivae, anicteric. Oropharyngeal exam unremarkable.  NECK:  Supple, no adenopathy, carotid bruit.  LUNGS:  Clear to auscultation bilaterally, no rhonchi, rales, or wheezing.  The patient had normal respiratory effort.  HEART:  Regular rate and rhythm, no significant murmurs, gallops, and rubs  appreciated.  ABDOMEN:  Soft, there was right lower quadrant tenderness,  questionable  mass/firmness, no organomegaly.  EXTREMITIES:  No cyanosis, clubbing, and edema, the patient had mild redness  of her left metatarsal joint.  NEUROLOGICAL:  Cranial nerves 2-12 intact, nonfocal.   IMPRESSION:  1.  GI bleed/bright red blood per rectum.  2.  Anemia.  3.  Hypokalemia.  4.  Azotemia/dehydration.  5.  Diarrhea.  6.  History of gout.  7.  Hypertension.  8.  History of COPD.  9.  Hypothyroidism.  10. History of breast cancer.  11. Depression.   RECOMMENDATIONS:  I suspect the patient has lower GI bleed, potential  etiologies include diverticular bleed, I doubt the patient has ischemic  colitis.  Also, the patient may have bleeding from hemorrhoids.  We will  monitor  H&H q.6h., consult [facility] for evaluation, provide IV  hydration, and replete potassium IV as well as provide IV magnesium.  We  will hold her Lasix and Zaroxolyn.  Continue her outpatient medications.  PT/INR will also be tested.      Thomos Lemons, D.O. [facility]  Electronically Signed     RY/MEDQ  D:  09/30/2005  T:  09/30/2005  Job:  [id number]   cc:   Rosalyn Gess. Norins, M.D. [facility]  [address]  [place]  [place] [address]

## 2010-11-15 ENCOUNTER — Encounter: Payer: Self-pay | Admitting: Gastroenterology

## 2011-08-09 ENCOUNTER — Other Ambulatory Visit: Payer: Self-pay | Admitting: Gynecologic Oncology

## 2011-08-09 ENCOUNTER — Other Ambulatory Visit: Payer: Self-pay | Admitting: Family Medicine

## 2011-08-09 DIAGNOSIS — Z1231 Encounter for screening mammogram for malignant neoplasm of breast: Secondary | ICD-10-CM

## 2011-08-24 ENCOUNTER — Ambulatory Visit
Admission: RE | Admit: 2011-08-24 | Discharge: 2011-08-24 | Disposition: A | Discharge status: Home / Self Care | Payer: Medicare Other | Source: Ambulatory Visit | Attending: Family Medicine | Admitting: Family Medicine

## 2011-08-24 DIAGNOSIS — Z1231 Encounter for screening mammogram for malignant neoplasm of breast: Secondary | ICD-10-CM

## 2012-03-01 ENCOUNTER — Encounter: Payer: Self-pay | Admitting: Gastroenterology

## 2012-07-17 ENCOUNTER — Other Ambulatory Visit: Payer: Self-pay | Admitting: Family Medicine

## 2012-07-17 ENCOUNTER — Other Ambulatory Visit: Payer: Self-pay | Admitting: Internal Medicine

## 2012-07-17 DIAGNOSIS — Z1231 Encounter for screening mammogram for malignant neoplasm of breast: Secondary | ICD-10-CM

## 2012-08-24 ENCOUNTER — Ambulatory Visit: Payer: Medicare Other

## 2012-09-26 ENCOUNTER — Ambulatory Visit
Admission: RE | Admit: 2012-09-26 | Discharge: 2012-09-26 | Disposition: A | Discharge status: Home / Self Care | Payer: Medicare Other | Source: Ambulatory Visit | Attending: Family Medicine | Admitting: Family Medicine

## 2012-09-26 DIAGNOSIS — Z1231 Encounter for screening mammogram for malignant neoplasm of breast: Secondary | ICD-10-CM

## 2014-08-01 DIAGNOSIS — N182 Chronic kidney disease, stage 2 (mild): Secondary | ICD-10-CM | POA: Diagnosis not present

## 2014-08-01 DIAGNOSIS — I129 Hypertensive chronic kidney disease with stage 1 through stage 4 chronic kidney disease, or unspecified chronic kidney disease: Secondary | ICD-10-CM | POA: Diagnosis not present

## 2014-08-01 DIAGNOSIS — E785 Hyperlipidemia, unspecified: Secondary | ICD-10-CM | POA: Diagnosis not present

## 2014-08-28 DIAGNOSIS — I129 Hypertensive chronic kidney disease with stage 1 through stage 4 chronic kidney disease, or unspecified chronic kidney disease: Secondary | ICD-10-CM | POA: Diagnosis not present

## 2014-08-28 DIAGNOSIS — E785 Hyperlipidemia, unspecified: Secondary | ICD-10-CM | POA: Diagnosis not present

## 2014-08-28 DIAGNOSIS — E039 Hypothyroidism, unspecified: Secondary | ICD-10-CM | POA: Diagnosis not present

## 2014-08-28 DIAGNOSIS — N182 Chronic kidney disease, stage 2 (mild): Secondary | ICD-10-CM | POA: Diagnosis not present

## 2014-09-04 DIAGNOSIS — Z139 Encounter for screening, unspecified: Secondary | ICD-10-CM | POA: Diagnosis not present

## 2014-09-04 DIAGNOSIS — Z1389 Encounter for screening for other disorder: Secondary | ICD-10-CM | POA: Diagnosis not present

## 2014-09-04 DIAGNOSIS — Z0000 Encounter for general adult medical examination without abnormal findings: Secondary | ICD-10-CM | POA: Diagnosis not present

## 2015-01-01 DIAGNOSIS — I129 Hypertensive chronic kidney disease with stage 1 through stage 4 chronic kidney disease, or unspecified chronic kidney disease: Secondary | ICD-10-CM | POA: Diagnosis not present

## 2015-01-01 DIAGNOSIS — N182 Chronic kidney disease, stage 2 (mild): Secondary | ICD-10-CM | POA: Diagnosis not present

## 2015-01-01 DIAGNOSIS — E785 Hyperlipidemia, unspecified: Secondary | ICD-10-CM | POA: Diagnosis not present

## 2015-01-08 DIAGNOSIS — N183 Chronic kidney disease, stage 3 (moderate): Secondary | ICD-10-CM | POA: Diagnosis not present

## 2015-01-08 DIAGNOSIS — E785 Hyperlipidemia, unspecified: Secondary | ICD-10-CM | POA: Diagnosis not present

## 2015-01-08 DIAGNOSIS — I129 Hypertensive chronic kidney disease with stage 1 through stage 4 chronic kidney disease, or unspecified chronic kidney disease: Secondary | ICD-10-CM | POA: Diagnosis not present

## 2015-01-08 DIAGNOSIS — I2510 Atherosclerotic heart disease of native coronary artery without angina pectoris: Secondary | ICD-10-CM | POA: Diagnosis not present

## 2015-02-05 ENCOUNTER — Encounter: Payer: Self-pay | Admitting: Gastroenterology

## 2015-03-24 DIAGNOSIS — Z23 Encounter for immunization: Secondary | ICD-10-CM | POA: Diagnosis not present

## 2015-05-05 DIAGNOSIS — Z853 Personal history of malignant neoplasm of breast: Secondary | ICD-10-CM | POA: Diagnosis not present

## 2015-05-05 DIAGNOSIS — I10 Essential (primary) hypertension: Secondary | ICD-10-CM | POA: Diagnosis not present

## 2015-05-05 DIAGNOSIS — N390 Urinary tract infection, site not specified: Secondary | ICD-10-CM | POA: Diagnosis not present

## 2015-05-05 DIAGNOSIS — S72142A Displaced intertrochanteric fracture of left femur, initial encounter for closed fracture: Secondary | ICD-10-CM | POA: Diagnosis not present

## 2015-05-05 DIAGNOSIS — Z0389 Encounter for observation for other suspected diseases and conditions ruled out: Secondary | ICD-10-CM | POA: Diagnosis not present

## 2015-05-05 DIAGNOSIS — R0902 Hypoxemia: Secondary | ICD-10-CM | POA: Diagnosis not present

## 2015-05-05 DIAGNOSIS — N3001 Acute cystitis with hematuria: Secondary | ICD-10-CM | POA: Diagnosis not present

## 2015-05-05 DIAGNOSIS — S32512A Fracture of superior rim of left pubis, initial encounter for closed fracture: Secondary | ICD-10-CM | POA: Diagnosis not present

## 2015-05-05 DIAGNOSIS — I252 Old myocardial infarction: Secondary | ICD-10-CM | POA: Diagnosis not present

## 2015-05-05 DIAGNOSIS — D72825 Bandemia: Secondary | ICD-10-CM | POA: Diagnosis not present

## 2015-05-05 DIAGNOSIS — E039 Hypothyroidism, unspecified: Secondary | ICD-10-CM | POA: Diagnosis not present

## 2015-05-05 DIAGNOSIS — E7800 Pure hypercholesterolemia, unspecified: Secondary | ICD-10-CM | POA: Diagnosis not present

## 2015-05-05 DIAGNOSIS — S32592A Other specified fracture of left pubis, initial encounter for closed fracture: Secondary | ICD-10-CM | POA: Diagnosis not present

## 2015-05-05 DIAGNOSIS — F329 Major depressive disorder, single episode, unspecified: Secondary | ICD-10-CM | POA: Diagnosis not present

## 2015-05-11 DIAGNOSIS — I10 Essential (primary) hypertension: Secondary | ICD-10-CM | POA: Diagnosis not present

## 2015-05-11 DIAGNOSIS — S32599D Other specified fracture of unspecified pubis, subsequent encounter for fracture with routine healing: Secondary | ICD-10-CM | POA: Diagnosis not present

## 2015-05-11 DIAGNOSIS — F329 Major depressive disorder, single episode, unspecified: Secondary | ICD-10-CM | POA: Diagnosis not present

## 2015-05-11 DIAGNOSIS — Z9981 Dependence on supplemental oxygen: Secondary | ICD-10-CM | POA: Diagnosis not present

## 2015-05-11 DIAGNOSIS — Z9181 History of falling: Secondary | ICD-10-CM | POA: Diagnosis not present

## 2015-05-11 DIAGNOSIS — R0902 Hypoxemia: Secondary | ICD-10-CM | POA: Diagnosis not present

## 2015-05-11 DIAGNOSIS — I252 Old myocardial infarction: Secondary | ICD-10-CM | POA: Diagnosis not present

## 2015-05-11 DIAGNOSIS — N3001 Acute cystitis with hematuria: Secondary | ICD-10-CM | POA: Diagnosis not present

## 2015-05-12 DIAGNOSIS — N390 Urinary tract infection, site not specified: Secondary | ICD-10-CM | POA: Diagnosis not present

## 2015-05-12 DIAGNOSIS — R0902 Hypoxemia: Secondary | ICD-10-CM | POA: Diagnosis not present

## 2015-05-12 DIAGNOSIS — S32599A Other specified fracture of unspecified pubis, initial encounter for closed fracture: Secondary | ICD-10-CM | POA: Diagnosis not present

## 2015-05-12 DIAGNOSIS — R609 Edema, unspecified: Secondary | ICD-10-CM | POA: Diagnosis not present

## 2015-05-18 DIAGNOSIS — R0902 Hypoxemia: Secondary | ICD-10-CM | POA: Diagnosis not present

## 2015-05-18 DIAGNOSIS — I10 Essential (primary) hypertension: Secondary | ICD-10-CM | POA: Diagnosis not present

## 2015-05-18 DIAGNOSIS — F329 Major depressive disorder, single episode, unspecified: Secondary | ICD-10-CM | POA: Diagnosis not present

## 2015-05-18 DIAGNOSIS — N3001 Acute cystitis with hematuria: Secondary | ICD-10-CM | POA: Diagnosis not present

## 2015-05-18 DIAGNOSIS — Z9981 Dependence on supplemental oxygen: Secondary | ICD-10-CM | POA: Diagnosis not present

## 2015-05-18 DIAGNOSIS — S32599D Other specified fracture of unspecified pubis, subsequent encounter for fracture with routine healing: Secondary | ICD-10-CM | POA: Diagnosis not present

## 2015-05-18 DIAGNOSIS — Z9181 History of falling: Secondary | ICD-10-CM | POA: Diagnosis not present

## 2015-05-18 DIAGNOSIS — I252 Old myocardial infarction: Secondary | ICD-10-CM | POA: Diagnosis not present

## 2015-05-20 DIAGNOSIS — I252 Old myocardial infarction: Secondary | ICD-10-CM | POA: Diagnosis not present

## 2015-05-20 DIAGNOSIS — F329 Major depressive disorder, single episode, unspecified: Secondary | ICD-10-CM | POA: Diagnosis not present

## 2015-05-20 DIAGNOSIS — E785 Hyperlipidemia, unspecified: Secondary | ICD-10-CM | POA: Diagnosis not present

## 2015-05-20 DIAGNOSIS — I129 Hypertensive chronic kidney disease with stage 1 through stage 4 chronic kidney disease, or unspecified chronic kidney disease: Secondary | ICD-10-CM | POA: Diagnosis not present

## 2015-05-20 DIAGNOSIS — N3001 Acute cystitis with hematuria: Secondary | ICD-10-CM | POA: Diagnosis not present

## 2015-05-20 DIAGNOSIS — Z9981 Dependence on supplemental oxygen: Secondary | ICD-10-CM | POA: Diagnosis not present

## 2015-05-20 DIAGNOSIS — Z9181 History of falling: Secondary | ICD-10-CM | POA: Diagnosis not present

## 2015-05-20 DIAGNOSIS — N183 Chronic kidney disease, stage 3 (moderate): Secondary | ICD-10-CM | POA: Diagnosis not present

## 2015-05-20 DIAGNOSIS — S32599D Other specified fracture of unspecified pubis, subsequent encounter for fracture with routine healing: Secondary | ICD-10-CM | POA: Diagnosis not present

## 2015-05-20 DIAGNOSIS — I10 Essential (primary) hypertension: Secondary | ICD-10-CM | POA: Diagnosis not present

## 2015-05-20 DIAGNOSIS — R0902 Hypoxemia: Secondary | ICD-10-CM | POA: Diagnosis not present

## 2015-05-20 DIAGNOSIS — D72829 Elevated white blood cell count, unspecified: Secondary | ICD-10-CM | POA: Diagnosis not present

## 2015-05-22 DIAGNOSIS — N3001 Acute cystitis with hematuria: Secondary | ICD-10-CM | POA: Diagnosis not present

## 2015-05-22 DIAGNOSIS — R0902 Hypoxemia: Secondary | ICD-10-CM | POA: Diagnosis not present

## 2015-05-22 DIAGNOSIS — Z9181 History of falling: Secondary | ICD-10-CM | POA: Diagnosis not present

## 2015-05-22 DIAGNOSIS — I10 Essential (primary) hypertension: Secondary | ICD-10-CM | POA: Diagnosis not present

## 2015-05-22 DIAGNOSIS — Z9981 Dependence on supplemental oxygen: Secondary | ICD-10-CM | POA: Diagnosis not present

## 2015-05-22 DIAGNOSIS — F329 Major depressive disorder, single episode, unspecified: Secondary | ICD-10-CM | POA: Diagnosis not present

## 2015-05-22 DIAGNOSIS — S32599D Other specified fracture of unspecified pubis, subsequent encounter for fracture with routine healing: Secondary | ICD-10-CM | POA: Diagnosis not present

## 2015-05-22 DIAGNOSIS — I252 Old myocardial infarction: Secondary | ICD-10-CM | POA: Diagnosis not present

## 2015-05-25 DIAGNOSIS — R0902 Hypoxemia: Secondary | ICD-10-CM | POA: Diagnosis not present

## 2015-05-25 DIAGNOSIS — N3001 Acute cystitis with hematuria: Secondary | ICD-10-CM | POA: Diagnosis not present

## 2015-05-25 DIAGNOSIS — F329 Major depressive disorder, single episode, unspecified: Secondary | ICD-10-CM | POA: Diagnosis not present

## 2015-05-25 DIAGNOSIS — S32599D Other specified fracture of unspecified pubis, subsequent encounter for fracture with routine healing: Secondary | ICD-10-CM | POA: Diagnosis not present

## 2015-05-25 DIAGNOSIS — I10 Essential (primary) hypertension: Secondary | ICD-10-CM | POA: Diagnosis not present

## 2015-05-25 DIAGNOSIS — Z9981 Dependence on supplemental oxygen: Secondary | ICD-10-CM | POA: Diagnosis not present

## 2015-05-25 DIAGNOSIS — Z9181 History of falling: Secondary | ICD-10-CM | POA: Diagnosis not present

## 2015-05-25 DIAGNOSIS — I252 Old myocardial infarction: Secondary | ICD-10-CM | POA: Diagnosis not present

## 2015-05-27 DIAGNOSIS — S32599D Other specified fracture of unspecified pubis, subsequent encounter for fracture with routine healing: Secondary | ICD-10-CM | POA: Diagnosis not present

## 2015-05-27 DIAGNOSIS — S3282XD Multiple fractures of pelvis without disruption of pelvic ring, subsequent encounter for fracture with routine healing: Secondary | ICD-10-CM | POA: Diagnosis not present

## 2015-05-27 DIAGNOSIS — Z9181 History of falling: Secondary | ICD-10-CM | POA: Diagnosis not present

## 2015-05-27 DIAGNOSIS — F329 Major depressive disorder, single episode, unspecified: Secondary | ICD-10-CM | POA: Diagnosis not present

## 2015-05-27 DIAGNOSIS — Z9981 Dependence on supplemental oxygen: Secondary | ICD-10-CM | POA: Diagnosis not present

## 2015-05-27 DIAGNOSIS — N183 Chronic kidney disease, stage 3 (moderate): Secondary | ICD-10-CM | POA: Diagnosis not present

## 2015-05-27 DIAGNOSIS — Z139 Encounter for screening, unspecified: Secondary | ICD-10-CM | POA: Diagnosis not present

## 2015-05-27 DIAGNOSIS — R0902 Hypoxemia: Secondary | ICD-10-CM | POA: Diagnosis not present

## 2015-05-27 DIAGNOSIS — I129 Hypertensive chronic kidney disease with stage 1 through stage 4 chronic kidney disease, or unspecified chronic kidney disease: Secondary | ICD-10-CM | POA: Diagnosis not present

## 2015-05-27 DIAGNOSIS — I252 Old myocardial infarction: Secondary | ICD-10-CM | POA: Diagnosis not present

## 2015-05-27 DIAGNOSIS — N3001 Acute cystitis with hematuria: Secondary | ICD-10-CM | POA: Diagnosis not present

## 2015-05-27 DIAGNOSIS — Z6837 Body mass index (BMI) 37.0-37.9, adult: Secondary | ICD-10-CM | POA: Diagnosis not present

## 2015-05-27 DIAGNOSIS — I10 Essential (primary) hypertension: Secondary | ICD-10-CM | POA: Diagnosis not present

## 2015-05-27 DIAGNOSIS — E785 Hyperlipidemia, unspecified: Secondary | ICD-10-CM | POA: Diagnosis not present

## 2015-05-29 DIAGNOSIS — Z9181 History of falling: Secondary | ICD-10-CM | POA: Diagnosis not present

## 2015-05-29 DIAGNOSIS — I252 Old myocardial infarction: Secondary | ICD-10-CM | POA: Diagnosis not present

## 2015-05-29 DIAGNOSIS — R0902 Hypoxemia: Secondary | ICD-10-CM | POA: Diagnosis not present

## 2015-05-29 DIAGNOSIS — Z9981 Dependence on supplemental oxygen: Secondary | ICD-10-CM | POA: Diagnosis not present

## 2015-05-29 DIAGNOSIS — N3001 Acute cystitis with hematuria: Secondary | ICD-10-CM | POA: Diagnosis not present

## 2015-05-29 DIAGNOSIS — S32599D Other specified fracture of unspecified pubis, subsequent encounter for fracture with routine healing: Secondary | ICD-10-CM | POA: Diagnosis not present

## 2015-05-29 DIAGNOSIS — I10 Essential (primary) hypertension: Secondary | ICD-10-CM | POA: Diagnosis not present

## 2015-05-29 DIAGNOSIS — F329 Major depressive disorder, single episode, unspecified: Secondary | ICD-10-CM | POA: Diagnosis not present

## 2015-06-02 DIAGNOSIS — I252 Old myocardial infarction: Secondary | ICD-10-CM | POA: Diagnosis not present

## 2015-06-02 DIAGNOSIS — R0902 Hypoxemia: Secondary | ICD-10-CM | POA: Diagnosis not present

## 2015-06-02 DIAGNOSIS — S32599D Other specified fracture of unspecified pubis, subsequent encounter for fracture with routine healing: Secondary | ICD-10-CM | POA: Diagnosis not present

## 2015-06-02 DIAGNOSIS — I10 Essential (primary) hypertension: Secondary | ICD-10-CM | POA: Diagnosis not present

## 2015-06-02 DIAGNOSIS — Z9981 Dependence on supplemental oxygen: Secondary | ICD-10-CM | POA: Diagnosis not present

## 2015-06-02 DIAGNOSIS — Z9181 History of falling: Secondary | ICD-10-CM | POA: Diagnosis not present

## 2015-06-02 DIAGNOSIS — F329 Major depressive disorder, single episode, unspecified: Secondary | ICD-10-CM | POA: Diagnosis not present

## 2015-06-02 DIAGNOSIS — N3001 Acute cystitis with hematuria: Secondary | ICD-10-CM | POA: Diagnosis not present

## 2015-06-03 DIAGNOSIS — E785 Hyperlipidemia, unspecified: Secondary | ICD-10-CM | POA: Diagnosis not present

## 2015-06-03 DIAGNOSIS — N183 Chronic kidney disease, stage 3 (moderate): Secondary | ICD-10-CM | POA: Diagnosis not present

## 2015-06-03 DIAGNOSIS — S32599D Other specified fracture of unspecified pubis, subsequent encounter for fracture with routine healing: Secondary | ICD-10-CM | POA: Diagnosis not present

## 2015-06-03 DIAGNOSIS — I129 Hypertensive chronic kidney disease with stage 1 through stage 4 chronic kidney disease, or unspecified chronic kidney disease: Secondary | ICD-10-CM | POA: Diagnosis not present

## 2015-06-03 DIAGNOSIS — R609 Edema, unspecified: Secondary | ICD-10-CM | POA: Diagnosis not present

## 2015-06-03 DIAGNOSIS — I252 Old myocardial infarction: Secondary | ICD-10-CM | POA: Diagnosis not present

## 2015-06-03 DIAGNOSIS — I10 Essential (primary) hypertension: Secondary | ICD-10-CM | POA: Diagnosis not present

## 2015-06-03 DIAGNOSIS — F329 Major depressive disorder, single episode, unspecified: Secondary | ICD-10-CM | POA: Diagnosis not present

## 2015-06-03 DIAGNOSIS — Z9981 Dependence on supplemental oxygen: Secondary | ICD-10-CM | POA: Diagnosis not present

## 2015-06-03 DIAGNOSIS — R0902 Hypoxemia: Secondary | ICD-10-CM | POA: Diagnosis not present

## 2015-06-03 DIAGNOSIS — Z9181 History of falling: Secondary | ICD-10-CM | POA: Diagnosis not present

## 2015-06-03 DIAGNOSIS — N3001 Acute cystitis with hematuria: Secondary | ICD-10-CM | POA: Diagnosis not present

## 2015-06-04 DIAGNOSIS — Z9181 History of falling: Secondary | ICD-10-CM | POA: Diagnosis not present

## 2015-06-04 DIAGNOSIS — I252 Old myocardial infarction: Secondary | ICD-10-CM | POA: Diagnosis not present

## 2015-06-04 DIAGNOSIS — F329 Major depressive disorder, single episode, unspecified: Secondary | ICD-10-CM | POA: Diagnosis not present

## 2015-06-04 DIAGNOSIS — N3001 Acute cystitis with hematuria: Secondary | ICD-10-CM | POA: Diagnosis not present

## 2015-06-04 DIAGNOSIS — S32599D Other specified fracture of unspecified pubis, subsequent encounter for fracture with routine healing: Secondary | ICD-10-CM | POA: Diagnosis not present

## 2015-06-04 DIAGNOSIS — Z9981 Dependence on supplemental oxygen: Secondary | ICD-10-CM | POA: Diagnosis not present

## 2015-06-04 DIAGNOSIS — I10 Essential (primary) hypertension: Secondary | ICD-10-CM | POA: Diagnosis not present

## 2015-06-04 DIAGNOSIS — R0902 Hypoxemia: Secondary | ICD-10-CM | POA: Diagnosis not present

## 2015-06-05 DIAGNOSIS — Z9181 History of falling: Secondary | ICD-10-CM | POA: Diagnosis not present

## 2015-06-05 DIAGNOSIS — N3001 Acute cystitis with hematuria: Secondary | ICD-10-CM | POA: Diagnosis not present

## 2015-06-05 DIAGNOSIS — I10 Essential (primary) hypertension: Secondary | ICD-10-CM | POA: Diagnosis not present

## 2015-06-05 DIAGNOSIS — S32599D Other specified fracture of unspecified pubis, subsequent encounter for fracture with routine healing: Secondary | ICD-10-CM | POA: Diagnosis not present

## 2015-06-05 DIAGNOSIS — Z9981 Dependence on supplemental oxygen: Secondary | ICD-10-CM | POA: Diagnosis not present

## 2015-06-05 DIAGNOSIS — F329 Major depressive disorder, single episode, unspecified: Secondary | ICD-10-CM | POA: Diagnosis not present

## 2015-06-05 DIAGNOSIS — R0902 Hypoxemia: Secondary | ICD-10-CM | POA: Diagnosis not present

## 2015-06-05 DIAGNOSIS — I252 Old myocardial infarction: Secondary | ICD-10-CM | POA: Diagnosis not present

## 2015-06-10 DIAGNOSIS — R0902 Hypoxemia: Secondary | ICD-10-CM | POA: Diagnosis not present

## 2015-06-10 DIAGNOSIS — I252 Old myocardial infarction: Secondary | ICD-10-CM | POA: Diagnosis not present

## 2015-06-10 DIAGNOSIS — Z9981 Dependence on supplemental oxygen: Secondary | ICD-10-CM | POA: Diagnosis not present

## 2015-06-10 DIAGNOSIS — N3001 Acute cystitis with hematuria: Secondary | ICD-10-CM | POA: Diagnosis not present

## 2015-06-10 DIAGNOSIS — F329 Major depressive disorder, single episode, unspecified: Secondary | ICD-10-CM | POA: Diagnosis not present

## 2015-06-10 DIAGNOSIS — Z9181 History of falling: Secondary | ICD-10-CM | POA: Diagnosis not present

## 2015-06-10 DIAGNOSIS — I10 Essential (primary) hypertension: Secondary | ICD-10-CM | POA: Diagnosis not present

## 2015-06-10 DIAGNOSIS — S32599D Other specified fracture of unspecified pubis, subsequent encounter for fracture with routine healing: Secondary | ICD-10-CM | POA: Diagnosis not present

## 2015-06-11 DIAGNOSIS — S32599D Other specified fracture of unspecified pubis, subsequent encounter for fracture with routine healing: Secondary | ICD-10-CM | POA: Diagnosis not present

## 2015-06-11 DIAGNOSIS — Z9981 Dependence on supplemental oxygen: Secondary | ICD-10-CM | POA: Diagnosis not present

## 2015-06-11 DIAGNOSIS — N3001 Acute cystitis with hematuria: Secondary | ICD-10-CM | POA: Diagnosis not present

## 2015-06-11 DIAGNOSIS — I10 Essential (primary) hypertension: Secondary | ICD-10-CM | POA: Diagnosis not present

## 2015-06-11 DIAGNOSIS — R0902 Hypoxemia: Secondary | ICD-10-CM | POA: Diagnosis not present

## 2015-06-11 DIAGNOSIS — F329 Major depressive disorder, single episode, unspecified: Secondary | ICD-10-CM | POA: Diagnosis not present

## 2015-06-11 DIAGNOSIS — I252 Old myocardial infarction: Secondary | ICD-10-CM | POA: Diagnosis not present

## 2015-06-11 DIAGNOSIS — Z9181 History of falling: Secondary | ICD-10-CM | POA: Diagnosis not present

## 2015-06-12 DIAGNOSIS — S32599D Other specified fracture of unspecified pubis, subsequent encounter for fracture with routine healing: Secondary | ICD-10-CM | POA: Diagnosis not present

## 2015-06-12 DIAGNOSIS — N3001 Acute cystitis with hematuria: Secondary | ICD-10-CM | POA: Diagnosis not present

## 2015-06-12 DIAGNOSIS — I252 Old myocardial infarction: Secondary | ICD-10-CM | POA: Diagnosis not present

## 2015-06-12 DIAGNOSIS — Z9981 Dependence on supplemental oxygen: Secondary | ICD-10-CM | POA: Diagnosis not present

## 2015-06-12 DIAGNOSIS — F329 Major depressive disorder, single episode, unspecified: Secondary | ICD-10-CM | POA: Diagnosis not present

## 2015-06-12 DIAGNOSIS — Z9181 History of falling: Secondary | ICD-10-CM | POA: Diagnosis not present

## 2015-06-12 DIAGNOSIS — I10 Essential (primary) hypertension: Secondary | ICD-10-CM | POA: Diagnosis not present

## 2015-06-12 DIAGNOSIS — R0902 Hypoxemia: Secondary | ICD-10-CM | POA: Diagnosis not present

## 2015-06-15 DIAGNOSIS — N3001 Acute cystitis with hematuria: Secondary | ICD-10-CM | POA: Diagnosis not present

## 2015-06-15 DIAGNOSIS — I252 Old myocardial infarction: Secondary | ICD-10-CM | POA: Diagnosis not present

## 2015-06-15 DIAGNOSIS — S32599D Other specified fracture of unspecified pubis, subsequent encounter for fracture with routine healing: Secondary | ICD-10-CM | POA: Diagnosis not present

## 2015-06-15 DIAGNOSIS — Z9981 Dependence on supplemental oxygen: Secondary | ICD-10-CM | POA: Diagnosis not present

## 2015-06-15 DIAGNOSIS — R0902 Hypoxemia: Secondary | ICD-10-CM | POA: Diagnosis not present

## 2015-06-15 DIAGNOSIS — Z9181 History of falling: Secondary | ICD-10-CM | POA: Diagnosis not present

## 2015-06-15 DIAGNOSIS — I10 Essential (primary) hypertension: Secondary | ICD-10-CM | POA: Diagnosis not present

## 2015-06-15 DIAGNOSIS — F329 Major depressive disorder, single episode, unspecified: Secondary | ICD-10-CM | POA: Diagnosis not present

## 2015-06-16 DIAGNOSIS — S32599D Other specified fracture of unspecified pubis, subsequent encounter for fracture with routine healing: Secondary | ICD-10-CM | POA: Diagnosis not present

## 2015-06-16 DIAGNOSIS — N3001 Acute cystitis with hematuria: Secondary | ICD-10-CM | POA: Diagnosis not present

## 2015-06-16 DIAGNOSIS — R0902 Hypoxemia: Secondary | ICD-10-CM | POA: Diagnosis not present

## 2015-06-16 DIAGNOSIS — I252 Old myocardial infarction: Secondary | ICD-10-CM | POA: Diagnosis not present

## 2015-06-16 DIAGNOSIS — I10 Essential (primary) hypertension: Secondary | ICD-10-CM | POA: Diagnosis not present

## 2015-06-16 DIAGNOSIS — F329 Major depressive disorder, single episode, unspecified: Secondary | ICD-10-CM | POA: Diagnosis not present

## 2015-06-16 DIAGNOSIS — Z9981 Dependence on supplemental oxygen: Secondary | ICD-10-CM | POA: Diagnosis not present

## 2015-06-16 DIAGNOSIS — Z9181 History of falling: Secondary | ICD-10-CM | POA: Diagnosis not present

## 2015-06-17 DIAGNOSIS — R609 Edema, unspecified: Secondary | ICD-10-CM | POA: Diagnosis not present

## 2015-06-17 DIAGNOSIS — I252 Old myocardial infarction: Secondary | ICD-10-CM | POA: Diagnosis not present

## 2015-06-17 DIAGNOSIS — Z9981 Dependence on supplemental oxygen: Secondary | ICD-10-CM | POA: Diagnosis not present

## 2015-06-17 DIAGNOSIS — Z9181 History of falling: Secondary | ICD-10-CM | POA: Diagnosis not present

## 2015-06-17 DIAGNOSIS — R0902 Hypoxemia: Secondary | ICD-10-CM | POA: Diagnosis not present

## 2015-06-17 DIAGNOSIS — D649 Anemia, unspecified: Secondary | ICD-10-CM | POA: Diagnosis not present

## 2015-06-17 DIAGNOSIS — N3001 Acute cystitis with hematuria: Secondary | ICD-10-CM | POA: Diagnosis not present

## 2015-06-17 DIAGNOSIS — Z6835 Body mass index (BMI) 35.0-35.9, adult: Secondary | ICD-10-CM | POA: Diagnosis not present

## 2015-06-17 DIAGNOSIS — F329 Major depressive disorder, single episode, unspecified: Secondary | ICD-10-CM | POA: Diagnosis not present

## 2015-06-17 DIAGNOSIS — I10 Essential (primary) hypertension: Secondary | ICD-10-CM | POA: Diagnosis not present

## 2015-06-17 DIAGNOSIS — S32599D Other specified fracture of unspecified pubis, subsequent encounter for fracture with routine healing: Secondary | ICD-10-CM | POA: Diagnosis not present

## 2015-06-18 DIAGNOSIS — F329 Major depressive disorder, single episode, unspecified: Secondary | ICD-10-CM | POA: Diagnosis not present

## 2015-06-18 DIAGNOSIS — R0902 Hypoxemia: Secondary | ICD-10-CM | POA: Diagnosis not present

## 2015-06-18 DIAGNOSIS — Z9181 History of falling: Secondary | ICD-10-CM | POA: Diagnosis not present

## 2015-06-18 DIAGNOSIS — S32599D Other specified fracture of unspecified pubis, subsequent encounter for fracture with routine healing: Secondary | ICD-10-CM | POA: Diagnosis not present

## 2015-06-18 DIAGNOSIS — I252 Old myocardial infarction: Secondary | ICD-10-CM | POA: Diagnosis not present

## 2015-06-18 DIAGNOSIS — N3001 Acute cystitis with hematuria: Secondary | ICD-10-CM | POA: Diagnosis not present

## 2015-06-18 DIAGNOSIS — Z9981 Dependence on supplemental oxygen: Secondary | ICD-10-CM | POA: Diagnosis not present

## 2015-06-18 DIAGNOSIS — I10 Essential (primary) hypertension: Secondary | ICD-10-CM | POA: Diagnosis not present

## 2015-06-19 DIAGNOSIS — N3001 Acute cystitis with hematuria: Secondary | ICD-10-CM | POA: Diagnosis not present

## 2015-06-19 DIAGNOSIS — Z9981 Dependence on supplemental oxygen: Secondary | ICD-10-CM | POA: Diagnosis not present

## 2015-06-19 DIAGNOSIS — Z9181 History of falling: Secondary | ICD-10-CM | POA: Diagnosis not present

## 2015-06-19 DIAGNOSIS — I252 Old myocardial infarction: Secondary | ICD-10-CM | POA: Diagnosis not present

## 2015-06-19 DIAGNOSIS — I10 Essential (primary) hypertension: Secondary | ICD-10-CM | POA: Diagnosis not present

## 2015-06-19 DIAGNOSIS — S32599D Other specified fracture of unspecified pubis, subsequent encounter for fracture with routine healing: Secondary | ICD-10-CM | POA: Diagnosis not present

## 2015-06-19 DIAGNOSIS — R0902 Hypoxemia: Secondary | ICD-10-CM | POA: Diagnosis not present

## 2015-06-19 DIAGNOSIS — F329 Major depressive disorder, single episode, unspecified: Secondary | ICD-10-CM | POA: Diagnosis not present

## 2015-07-22 DIAGNOSIS — D649 Anemia, unspecified: Secondary | ICD-10-CM | POA: Diagnosis not present

## 2015-07-22 DIAGNOSIS — W19XXXD Unspecified fall, subsequent encounter: Secondary | ICD-10-CM | POA: Diagnosis not present

## 2015-07-22 DIAGNOSIS — Z6834 Body mass index (BMI) 34.0-34.9, adult: Secondary | ICD-10-CM | POA: Diagnosis not present

## 2015-07-22 DIAGNOSIS — R609 Edema, unspecified: Secondary | ICD-10-CM | POA: Diagnosis not present

## 2015-07-30 DIAGNOSIS — Z6834 Body mass index (BMI) 34.0-34.9, adult: Secondary | ICD-10-CM | POA: Diagnosis not present

## 2015-07-30 DIAGNOSIS — F329 Major depressive disorder, single episode, unspecified: Secondary | ICD-10-CM | POA: Diagnosis not present

## 2015-07-30 DIAGNOSIS — F419 Anxiety disorder, unspecified: Secondary | ICD-10-CM | POA: Diagnosis not present

## 2015-09-08 DIAGNOSIS — Z9181 History of falling: Secondary | ICD-10-CM | POA: Diagnosis not present

## 2015-09-08 DIAGNOSIS — Z0000 Encounter for general adult medical examination without abnormal findings: Secondary | ICD-10-CM | POA: Diagnosis not present

## 2015-09-08 DIAGNOSIS — I129 Hypertensive chronic kidney disease with stage 1 through stage 4 chronic kidney disease, or unspecified chronic kidney disease: Secondary | ICD-10-CM | POA: Diagnosis not present

## 2015-09-08 DIAGNOSIS — E039 Hypothyroidism, unspecified: Secondary | ICD-10-CM | POA: Diagnosis not present

## 2015-09-08 DIAGNOSIS — E785 Hyperlipidemia, unspecified: Secondary | ICD-10-CM | POA: Diagnosis not present

## 2015-09-08 DIAGNOSIS — Z1389 Encounter for screening for other disorder: Secondary | ICD-10-CM | POA: Diagnosis not present

## 2015-09-08 DIAGNOSIS — N183 Chronic kidney disease, stage 3 (moderate): Secondary | ICD-10-CM | POA: Diagnosis not present

## 2015-09-08 DIAGNOSIS — Z139 Encounter for screening, unspecified: Secondary | ICD-10-CM | POA: Diagnosis not present

## 2015-09-22 DIAGNOSIS — N289 Disorder of kidney and ureter, unspecified: Secondary | ICD-10-CM | POA: Diagnosis not present

## 2015-11-02 DIAGNOSIS — H903 Sensorineural hearing loss, bilateral: Secondary | ICD-10-CM | POA: Diagnosis not present

## 2015-11-02 DIAGNOSIS — H9193 Unspecified hearing loss, bilateral: Secondary | ICD-10-CM | POA: Diagnosis not present

## 2015-11-02 DIAGNOSIS — H9319 Tinnitus, unspecified ear: Secondary | ICD-10-CM | POA: Diagnosis not present

## 2015-11-25 DIAGNOSIS — I129 Hypertensive chronic kidney disease with stage 1 through stage 4 chronic kidney disease, or unspecified chronic kidney disease: Secondary | ICD-10-CM | POA: Diagnosis not present

## 2015-11-25 DIAGNOSIS — E039 Hypothyroidism, unspecified: Secondary | ICD-10-CM | POA: Diagnosis not present

## 2015-11-25 DIAGNOSIS — N183 Chronic kidney disease, stage 3 (moderate): Secondary | ICD-10-CM | POA: Diagnosis not present

## 2015-11-25 DIAGNOSIS — E785 Hyperlipidemia, unspecified: Secondary | ICD-10-CM | POA: Diagnosis not present

## 2015-12-07 DIAGNOSIS — N183 Chronic kidney disease, stage 3 (moderate): Secondary | ICD-10-CM | POA: Diagnosis not present

## 2015-12-07 DIAGNOSIS — I129 Hypertensive chronic kidney disease with stage 1 through stage 4 chronic kidney disease, or unspecified chronic kidney disease: Secondary | ICD-10-CM | POA: Diagnosis not present

## 2015-12-07 DIAGNOSIS — E039 Hypothyroidism, unspecified: Secondary | ICD-10-CM | POA: Diagnosis not present

## 2015-12-07 DIAGNOSIS — E785 Hyperlipidemia, unspecified: Secondary | ICD-10-CM | POA: Diagnosis not present

## 2016-01-19 DIAGNOSIS — E039 Hypothyroidism, unspecified: Secondary | ICD-10-CM | POA: Diagnosis not present

## 2016-04-13 DIAGNOSIS — Z23 Encounter for immunization: Secondary | ICD-10-CM | POA: Diagnosis not present

## 2016-05-10 DIAGNOSIS — R1032 Left lower quadrant pain: Secondary | ICD-10-CM | POA: Diagnosis not present

## 2016-05-10 DIAGNOSIS — Z6835 Body mass index (BMI) 35.0-35.9, adult: Secondary | ICD-10-CM | POA: Diagnosis not present

## 2016-05-10 DIAGNOSIS — W19XXXA Unspecified fall, initial encounter: Secondary | ICD-10-CM | POA: Diagnosis not present

## 2016-05-11 DIAGNOSIS — R1032 Left lower quadrant pain: Secondary | ICD-10-CM | POA: Diagnosis not present

## 2016-05-11 DIAGNOSIS — M25559 Pain in unspecified hip: Secondary | ICD-10-CM | POA: Diagnosis not present

## 2016-05-11 DIAGNOSIS — S32592A Other specified fracture of left pubis, initial encounter for closed fracture: Secondary | ICD-10-CM | POA: Diagnosis not present

## 2016-05-18 DIAGNOSIS — S32599A Other specified fracture of unspecified pubis, initial encounter for closed fracture: Secondary | ICD-10-CM | POA: Diagnosis not present

## 2016-05-18 DIAGNOSIS — Z6835 Body mass index (BMI) 35.0-35.9, adult: Secondary | ICD-10-CM | POA: Diagnosis not present

## 2016-10-19 DIAGNOSIS — Z1389 Encounter for screening for other disorder: Secondary | ICD-10-CM | POA: Diagnosis not present

## 2016-10-19 DIAGNOSIS — Z139 Encounter for screening, unspecified: Secondary | ICD-10-CM | POA: Diagnosis not present

## 2016-10-19 DIAGNOSIS — Z0000 Encounter for general adult medical examination without abnormal findings: Secondary | ICD-10-CM | POA: Diagnosis not present

## 2016-10-19 DIAGNOSIS — Z7189 Other specified counseling: Secondary | ICD-10-CM | POA: Diagnosis not present

## 2017-04-19 DIAGNOSIS — Z23 Encounter for immunization: Secondary | ICD-10-CM | POA: Diagnosis not present

## 2017-07-06 DIAGNOSIS — K2900 Acute gastritis without bleeding: Secondary | ICD-10-CM | POA: Diagnosis not present

## 2017-07-06 DIAGNOSIS — Z6835 Body mass index (BMI) 35.0-35.9, adult: Secondary | ICD-10-CM | POA: Diagnosis not present

## 2017-07-06 DIAGNOSIS — G2581 Restless legs syndrome: Secondary | ICD-10-CM | POA: Diagnosis not present

## 2017-08-07 DIAGNOSIS — G2581 Restless legs syndrome: Secondary | ICD-10-CM | POA: Diagnosis not present

## 2017-08-07 DIAGNOSIS — L84 Corns and callosities: Secondary | ICD-10-CM | POA: Diagnosis not present

## 2017-08-07 DIAGNOSIS — Z6835 Body mass index (BMI) 35.0-35.9, adult: Secondary | ICD-10-CM | POA: Diagnosis not present

## 2017-08-14 DIAGNOSIS — E039 Hypothyroidism, unspecified: Secondary | ICD-10-CM | POA: Diagnosis not present

## 2017-08-14 DIAGNOSIS — I129 Hypertensive chronic kidney disease with stage 1 through stage 4 chronic kidney disease, or unspecified chronic kidney disease: Secondary | ICD-10-CM | POA: Diagnosis not present

## 2017-08-14 DIAGNOSIS — E785 Hyperlipidemia, unspecified: Secondary | ICD-10-CM | POA: Diagnosis not present

## 2017-08-21 DIAGNOSIS — E785 Hyperlipidemia, unspecified: Secondary | ICD-10-CM | POA: Diagnosis not present

## 2017-08-21 DIAGNOSIS — I2510 Atherosclerotic heart disease of native coronary artery without angina pectoris: Secondary | ICD-10-CM | POA: Diagnosis not present

## 2017-08-21 DIAGNOSIS — Z139 Encounter for screening, unspecified: Secondary | ICD-10-CM | POA: Diagnosis not present

## 2017-08-21 DIAGNOSIS — I129 Hypertensive chronic kidney disease with stage 1 through stage 4 chronic kidney disease, or unspecified chronic kidney disease: Secondary | ICD-10-CM | POA: Diagnosis not present

## 2017-08-21 DIAGNOSIS — Z0000 Encounter for general adult medical examination without abnormal findings: Secondary | ICD-10-CM | POA: Diagnosis not present

## 2017-08-21 DIAGNOSIS — N183 Chronic kidney disease, stage 3 (moderate): Secondary | ICD-10-CM | POA: Diagnosis not present

## 2017-08-21 DIAGNOSIS — Z1331 Encounter for screening for depression: Secondary | ICD-10-CM | POA: Diagnosis not present

## 2017-09-07 DIAGNOSIS — L84 Corns and callosities: Secondary | ICD-10-CM | POA: Diagnosis not present

## 2017-09-07 DIAGNOSIS — L602 Onychogryphosis: Secondary | ICD-10-CM | POA: Diagnosis not present

## 2017-09-07 DIAGNOSIS — M21611 Bunion of right foot: Secondary | ICD-10-CM | POA: Diagnosis not present

## 2018-02-19 DIAGNOSIS — E785 Hyperlipidemia, unspecified: Secondary | ICD-10-CM | POA: Diagnosis not present

## 2018-02-19 DIAGNOSIS — E039 Hypothyroidism, unspecified: Secondary | ICD-10-CM | POA: Diagnosis not present

## 2018-02-19 DIAGNOSIS — I129 Hypertensive chronic kidney disease with stage 1 through stage 4 chronic kidney disease, or unspecified chronic kidney disease: Secondary | ICD-10-CM | POA: Diagnosis not present

## 2018-03-01 DIAGNOSIS — Z139 Encounter for screening, unspecified: Secondary | ICD-10-CM | POA: Diagnosis not present

## 2018-03-01 DIAGNOSIS — I129 Hypertensive chronic kidney disease with stage 1 through stage 4 chronic kidney disease, or unspecified chronic kidney disease: Secondary | ICD-10-CM | POA: Diagnosis not present

## 2018-03-01 DIAGNOSIS — Z23 Encounter for immunization: Secondary | ICD-10-CM | POA: Diagnosis not present

## 2018-03-01 DIAGNOSIS — Z1331 Encounter for screening for depression: Secondary | ICD-10-CM | POA: Diagnosis not present

## 2018-03-01 DIAGNOSIS — E039 Hypothyroidism, unspecified: Secondary | ICD-10-CM | POA: Diagnosis not present

## 2018-03-01 DIAGNOSIS — E785 Hyperlipidemia, unspecified: Secondary | ICD-10-CM | POA: Diagnosis not present

## 2018-03-01 DIAGNOSIS — N183 Chronic kidney disease, stage 3 (moderate): Secondary | ICD-10-CM | POA: Diagnosis not present

## 2018-07-02 DIAGNOSIS — E785 Hyperlipidemia, unspecified: Secondary | ICD-10-CM | POA: Diagnosis not present

## 2018-07-02 DIAGNOSIS — E039 Hypothyroidism, unspecified: Secondary | ICD-10-CM | POA: Diagnosis not present

## 2018-07-02 DIAGNOSIS — I129 Hypertensive chronic kidney disease with stage 1 through stage 4 chronic kidney disease, or unspecified chronic kidney disease: Secondary | ICD-10-CM | POA: Diagnosis not present

## 2018-07-26 DIAGNOSIS — Z0000 Encounter for general adult medical examination without abnormal findings: Secondary | ICD-10-CM | POA: Diagnosis not present

## 2018-07-26 DIAGNOSIS — F339 Major depressive disorder, recurrent, unspecified: Secondary | ICD-10-CM | POA: Diagnosis not present

## 2018-07-26 DIAGNOSIS — J449 Chronic obstructive pulmonary disease, unspecified: Secondary | ICD-10-CM | POA: Diagnosis not present

## 2018-07-26 DIAGNOSIS — I129 Hypertensive chronic kidney disease with stage 1 through stage 4 chronic kidney disease, or unspecified chronic kidney disease: Secondary | ICD-10-CM | POA: Diagnosis not present

## 2018-07-26 DIAGNOSIS — Z139 Encounter for screening, unspecified: Secondary | ICD-10-CM | POA: Diagnosis not present

## 2018-07-26 DIAGNOSIS — Z1331 Encounter for screening for depression: Secondary | ICD-10-CM | POA: Diagnosis not present

## 2018-07-26 DIAGNOSIS — N183 Chronic kidney disease, stage 3 (moderate): Secondary | ICD-10-CM | POA: Diagnosis not present

## 2018-08-16 DIAGNOSIS — Z6835 Body mass index (BMI) 35.0-35.9, adult: Secondary | ICD-10-CM | POA: Diagnosis not present

## 2018-08-16 DIAGNOSIS — R040 Epistaxis: Secondary | ICD-10-CM | POA: Diagnosis not present

## 2018-11-18 DIAGNOSIS — S8991XA Unspecified injury of right lower leg, initial encounter: Secondary | ICD-10-CM | POA: Diagnosis not present

## 2018-11-18 DIAGNOSIS — M542 Cervicalgia: Secondary | ICD-10-CM | POA: Diagnosis not present

## 2018-11-18 DIAGNOSIS — R51 Headache: Secondary | ICD-10-CM | POA: Diagnosis not present

## 2018-11-18 DIAGNOSIS — S6992XA Unspecified injury of left wrist, hand and finger(s), initial encounter: Secondary | ICD-10-CM | POA: Diagnosis not present

## 2018-11-18 DIAGNOSIS — S0990XA Unspecified injury of head, initial encounter: Secondary | ICD-10-CM | POA: Diagnosis not present

## 2018-11-18 DIAGNOSIS — S42292A Other displaced fracture of upper end of left humerus, initial encounter for closed fracture: Secondary | ICD-10-CM | POA: Diagnosis not present

## 2018-11-18 DIAGNOSIS — R52 Pain, unspecified: Secondary | ICD-10-CM | POA: Diagnosis not present

## 2018-11-18 DIAGNOSIS — W19XXXA Unspecified fall, initial encounter: Secondary | ICD-10-CM | POA: Diagnosis not present

## 2018-11-18 DIAGNOSIS — M25552 Pain in left hip: Secondary | ICD-10-CM | POA: Diagnosis not present

## 2018-11-18 DIAGNOSIS — S42212A Unspecified displaced fracture of surgical neck of left humerus, initial encounter for closed fracture: Secondary | ICD-10-CM | POA: Diagnosis not present

## 2018-11-18 DIAGNOSIS — M25561 Pain in right knee: Secondary | ICD-10-CM | POA: Diagnosis not present

## 2018-11-18 DIAGNOSIS — S199XXA Unspecified injury of neck, initial encounter: Secondary | ICD-10-CM | POA: Diagnosis not present

## 2018-11-18 DIAGNOSIS — M25532 Pain in left wrist: Secondary | ICD-10-CM | POA: Diagnosis not present

## 2018-11-18 DIAGNOSIS — I10 Essential (primary) hypertension: Secondary | ICD-10-CM | POA: Diagnosis not present

## 2018-11-18 DIAGNOSIS — S42215A Unspecified nondisplaced fracture of surgical neck of left humerus, initial encounter for closed fracture: Secondary | ICD-10-CM | POA: Diagnosis not present

## 2018-11-18 DIAGNOSIS — S79912A Unspecified injury of left hip, initial encounter: Secondary | ICD-10-CM | POA: Diagnosis not present

## 2018-11-21 DIAGNOSIS — S42202A Unspecified fracture of upper end of left humerus, initial encounter for closed fracture: Secondary | ICD-10-CM | POA: Diagnosis not present

## 2018-11-26 ENCOUNTER — Ambulatory Visit (INDEPENDENT_AMBULATORY_CARE_PROVIDER_SITE_OTHER): Payer: Medicare HMO | Admitting: Orthopedic Surgery

## 2018-11-26 ENCOUNTER — Other Ambulatory Visit: Payer: Self-pay

## 2018-11-26 ENCOUNTER — Encounter: Payer: Self-pay | Admitting: Orthopedic Surgery

## 2018-11-26 VITALS — Ht 66.0 in | Wt 199.0 lb

## 2018-11-26 DIAGNOSIS — S42292A Other displaced fracture of upper end of left humerus, initial encounter for closed fracture: Secondary | ICD-10-CM | POA: Diagnosis not present

## 2018-11-26 NOTE — Progress Notes (Signed)
Office Visit Note   Patient: [name]           Date of Birth: [birth year]           MRN: [id number] Visit Date: 11/26/2018              Requested by: Neena Rhymes, MD [address] [place],  [place] [address] PCP: Neena Rhymes, MD  Chief Complaint  Patient presents with  . Left Upper Arm - Pain    NP; Left fx humerus, pt fell 11/18/2018      HPI: Patient is an 85 year old woman who states she slipped getting into a shower sustaining a displaced left proximal humerus fracture 1 week ago.  Patient did go to [facility] was seen by orthopedics and conservative treatment was recommended for 2 weeks.  Patient states she wants to get her shoulder fixed as soon as possible regardless of what it takes.  Assessment & Plan: Visit Diagnoses:  1. Other closed displaced fracture of proximal end of left humerus, initial encounter     Plan: Will obtain thin slice CT scan and have patient follow-up with Dr. Marlou Sa.  Surgical treatment options as per her Dr. Randel Pigg recommendation after reviewing the CT scan.  Follow-Up Instructions: Return in about 1 week (around 12/03/2018).   Ortho Exam  Patient is alert, oriented, no adenopathy, well-dressed, normal affect, normal respiratory effort. Examination patient is ambulating in a wheelchair.  Patient states she normally uses a walker and has to use a walker for ambulation.  She has a sling in place multiple bruises from her fall.  She does have shortness of breath while being seated but denies using oxygen or a CPAP machine.  Patient's hand is neurovascular intact.  Review of the radiographs shows a displaced proximal humerus fracture with comminution of the ball.  Subluxation of the glenohumeral joint.  Imaging: No results found. No images are attached to the encounter.  Labs: No results found for: HGBA1C, ESRSEDRATE, CRP, LABURIC, REPTSTATUS, GRAMSTAIN, CULT, LABORGA   No results found for: ALBUMIN, PREALBUMIN, LABURIC  Body mass  index is 32.12 kg/m.  Orders:  Orders Placed This Encounter  Procedures  . CT SHOULDER LEFT WO CONTRAST   No orders of the defined types were placed in this encounter.    Procedures: No procedures performed  Clinical Data: No additional findings.  ROS:  All other systems negative, except as noted in the HPI. Review of Systems  Objective: Vital Signs: Ht 5\' 6"  (1.676 m)   Wt 199 lb (90.3 kg)   BMI 32.12 kg/m   Specialty Comments:  No specialty comments available.  PMFS History: Patient Active Problem List   Diagnosis Date Noted  . ALLERGIC RHINITIS 10/16/2007  . URI 06/20/2007  . TMJ PAIN 06/20/2007  . UTI 06/20/2007  . ACUTE CYSTITIS 06/18/2007  . CHEST WALL PAIN, ACUTE 05/11/2007  . INSOMNIA UNSPECIFIED 04/27/2007  . HYPOTHYROIDISM 01/05/2007  . HYPERLIPIDEMIA 01/05/2007  . GOUT 01/05/2007  . ANXIETY 01/05/2007  . DEPRESSION 01/05/2007  . HYPERTENSION 01/05/2007  . CORONARY ARTERY DISEASE 01/05/2007  . COPD 01/05/2007  . ESOPHAGEAL STRICTURE 01/05/2007  . SPINAL STENOSIS 01/05/2007  . VASOVAGAL SYNCOPE 01/05/2007  . PERIPHERAL EDEMA 01/05/2007  . BREAST CANCER, HX OF 01/05/2007  . Personal history of other diseases of digestive system 01/05/2007   History reviewed. No pertinent past medical history.  History reviewed. No pertinent family history.  History reviewed. No pertinent surgical history. Social History   Occupational  History  . Not on file  Tobacco Use  . Smoking status: Never Smoker  . Smokeless tobacco: Never Used  Substance and Sexual Activity  . Alcohol use: Never    Frequency: Never  . Drug use: Never  . Sexual activity: Not Currently

## 2018-11-27 ENCOUNTER — Ambulatory Visit
Admission: RE | Admit: 2018-11-27 | Discharge: 2018-11-27 | Disposition: A | Discharge status: Home / Self Care | Payer: Medicare HMO | Source: Ambulatory Visit | Attending: Orthopedic Surgery | Admitting: Orthopedic Surgery

## 2018-11-27 ENCOUNTER — Ambulatory Visit: Payer: Self-pay | Admitting: Orthopedic Surgery

## 2018-11-27 DIAGNOSIS — S42212A Unspecified displaced fracture of surgical neck of left humerus, initial encounter for closed fracture: Secondary | ICD-10-CM | POA: Diagnosis not present

## 2018-11-27 DIAGNOSIS — S43015A Anterior dislocation of left humerus, initial encounter: Secondary | ICD-10-CM | POA: Diagnosis not present

## 2018-11-27 DIAGNOSIS — S42292A Other displaced fracture of upper end of left humerus, initial encounter for closed fracture: Secondary | ICD-10-CM

## 2018-11-29 ENCOUNTER — Encounter: Payer: Self-pay | Admitting: Orthopedic Surgery

## 2018-11-29 ENCOUNTER — Ambulatory Visit (INDEPENDENT_AMBULATORY_CARE_PROVIDER_SITE_OTHER): Payer: Medicare HMO | Admitting: Orthopedic Surgery

## 2018-11-29 ENCOUNTER — Other Ambulatory Visit: Payer: Self-pay

## 2018-11-29 DIAGNOSIS — S42292A Other displaced fracture of upper end of left humerus, initial encounter for closed fracture: Secondary | ICD-10-CM | POA: Diagnosis not present

## 2018-11-30 ENCOUNTER — Encounter: Payer: Self-pay | Admitting: Orthopedic Surgery

## 2018-11-30 NOTE — Progress Notes (Signed)
Office Visit Note   Patient: [name]           Date of Birth: 05-09-1928           MRN: [id number] Visit Date: 11/29/2018 Requested by: Neena Rhymes, MD [place],  [place] [address] PCP: Neena Rhymes, MD  Subjective: Chief Complaint  Patient presents with  . Left Shoulder - Pain    HPI: Patient presents for evaluation of left shoulder pain.  She was seen by Dr. Sharol Given earlier and had a CT scan of her left shoulder.  Date of injury 11/18/2018 where she fell in the shower.  She states she has been in the sling since that time.  She is right-hand dominant.  Taking Advil for pain.  She does live by herself.  She is here with her son.  She used to work as a Psychiatric nurse.  She is having significant pain in that left shoulder and diminished function.  She denies any other orthopedic complaints.  CT scan demonstrates significantly displaced proximal humerus fracture.  There is comminution involving the humeral head in the metaphyseal region.  Shaft is displaced about 1 with anterior to the head.  The head does remain located but inferiorly subluxated.              ROS: All systems reviewed are negative as they relate to the chief complaint within the history of present illness.  Patient denies  fevers or chills.   Assessment & Plan: Visit Diagnoses:  1. Other closed displaced fracture of proximal end of left humerus, initial encounter     Plan: Impression is left proximal humerus fracture with displacement and comminution.  I discussed with [name] and her son at length operative and nonoperative treatment options.  Nonoperative treatment options would likely achieve some healing of the fracture with some limited range of motion and function of the shoulder.  I think that I have seen patients choose that route.  Operative options include reverse shoulder replacement with the inherent risk of surgery along with dislocation infection nerve vessel damage as well as implant failure  due to osteoporosis and poor bone quality.  Patient understands the risk and benefits of each treatment option.  In general she would like to achieve a higher level of function than what she would regain with nonoperative treatment.  I do think there are risk in her case.  Patient understands and wants to except that moderate risk for complication in order to have better functional outcome.  Patient understands the risks and benefits and wishes to proceed with reverse shoulder replacement.  Risk all questions answered  Follow-Up Instructions: No follow-ups on file.   Orders:  No orders of the defined types were placed in this encounter.  No orders of the defined types were placed in this encounter.     Procedures: No procedures performed   Clinical Data: No additional findings.  Objective: Vital Signs: There were no vitals taken for this visit.  Physical Exam:   Constitutional: Patient appears well-developed HEENT:  Head: Normocephalic Eyes:EOM are normal Neck: Normal range of motion Cardiovascular: Normal rate Pulmonary/chest: Effort normal Neurologic: Patient is alert Skin: Skin is warm Psychiatric: Patient has normal mood and affect    Ortho Exam: Ortho exam demonstrates functional deltoid with appropriate amount of swelling in that left arm.  Patient has palpable radial pulse on the left.  She has pain with range of motion and no real functional healing at this  time of the fracture in terms of movement as a unit.  Biceps triceps function intact.  Specialty Comments:  No specialty comments available.  Imaging: No results found.   PMFS History: Patient Active Problem List   Diagnosis Date Noted  . ALLERGIC RHINITIS 10/16/2007  . URI 06/20/2007  . TMJ PAIN 06/20/2007  . UTI 06/20/2007  . ACUTE CYSTITIS 06/18/2007  . CHEST WALL PAIN, ACUTE 05/11/2007  . INSOMNIA UNSPECIFIED 04/27/2007  . HYPOTHYROIDISM 01/05/2007  . HYPERLIPIDEMIA 01/05/2007  . GOUT 01/05/2007   . ANXIETY 01/05/2007  . DEPRESSION 01/05/2007  . HYPERTENSION 01/05/2007  . CORONARY ARTERY DISEASE 01/05/2007  . COPD 01/05/2007  . ESOPHAGEAL STRICTURE 01/05/2007  . SPINAL STENOSIS 01/05/2007  . VASOVAGAL SYNCOPE 01/05/2007  . PERIPHERAL EDEMA 01/05/2007  . BREAST CANCER, HX OF 01/05/2007  . Personal history of other diseases of digestive system 01/05/2007   History reviewed. No pertinent past medical history.  History reviewed. No pertinent family history.  History reviewed. No pertinent surgical history. Social History   Occupational History  . Not on file  Tobacco Use  . Smoking status: Never Smoker  . Smokeless tobacco: Never Used  Substance and Sexual Activity  . Alcohol use: Never    Frequency: Never  . Drug use: Never  . Sexual activity: Not Currently

## 2018-12-03 ENCOUNTER — Other Ambulatory Visit: Payer: Self-pay

## 2018-12-03 ENCOUNTER — Other Ambulatory Visit (HOSPITAL_COMMUNITY)
Admission: RE | Admit: 2018-12-03 | Discharge: 2018-12-03 | Disposition: A | Discharge status: Home / Self Care | Payer: Medicare HMO | Source: Ambulatory Visit | Attending: Orthopedic Surgery | Admitting: Orthopedic Surgery

## 2018-12-03 DIAGNOSIS — Z1159 Encounter for screening for other viral diseases: Secondary | ICD-10-CM | POA: Insufficient documentation

## 2018-12-03 LAB — SARS CORONAVIRUS 2 (TAT 6-24 HRS): SARS Coronavirus 2: NEGATIVE

## 2018-12-03 NOTE — Progress Notes (Signed)
[id number] - [place], [place] - [address] [place] [address] Phone: [phone] Fax: [phone]      Your procedure is scheduled on June 25  Report to [facility] Main Entrance "A" at 0700 A.M., and check in at the Admitting office.  Call this number if you have problems the morning of surgery:  [phone]  Call [phone] if you have any questions prior to your surgery date Monday-Friday 8am-4pm    Remember:  Do not eat or drink after midnight.     Take these medicines the morning of surgery with A SIP OF WATER  acebutolol (SECTRAL) albuterol (PROVENTIL) if needed amLODipine (NORVASC) esomeprazole (NEXIUM) levothyroxine (SYNTHROID  Follow your surgeon's instructions on when to stop Aspirin.  If no instructions were given by your surgeon then you will need to call the office to get those instructions.    7 days prior to surgery STOP taking any celecoxib (CELEBREX), Aspirin (unless otherwise instructed by your surgeon), Aleve, Naproxen, Ibuprofen, Motrin, Advil, Goody's, BC's, all herbal medications, fish oil, and all vitamins.    The Morning of Surgery  Do not wear jewelry, make-up or nail polish.  Do not wear lotions, powders, or perfumes/colognes, or deodorant  Do not shave 48 hours prior to surgery.  Men may shave face and neck.  Do not bring valuables to the hospital.  [facility] is not responsible for any belongings or valuables.  If you are a smoker, DO NOT Smoke 24 hours prior to surgery IF you wear a CPAP at night please bring your mask, tubing, and machine the morning of surgery   Remember that you must have someone to transport you home after your surgery, and remain with you for 24 hours if you are discharged the same day.   Contacts, glasses, hearing aids, dentures or bridgework may not be worn into surgery.    Leave your suitcase in the car.  After surgery it may be brought to your room.  For patients admitted  to the hospital, discharge time will be determined by your treatment team.  Patients discharged the day of surgery will not be allowed to drive home.    Special instructions:   [place]- Preparing For Surgery  Before surgery, you can play an important role. Because skin is not sterile, your skin needs to be as free of germs as possible. You can reduce the number of germs on your skin by washing with CHG (chlorahexidine gluconate) Soap before surgery.  CHG is an antiseptic cleaner which kills germs and bonds with the skin to continue killing germs even after washing.    Oral Hygiene is also important to reduce your risk of infection.  Remember - BRUSH YOUR TEETH THE MORNING OF SURGERY WITH YOUR REGULAR TOOTHPASTE  Please do not use if you have an allergy to CHG or antibacterial soaps. If your skin becomes reddened/irritated stop using the CHG.  Do not shave (including legs and underarms) for at least 48 hours prior to first CHG shower. It is OK to shave your face.  Please follow these instructions carefully.   1. Shower the NIGHT BEFORE SURGERY and the MORNING OF SURGERY with CHG Soap.   2. If you chose to wash your hair, wash your hair first as usual with your normal shampoo.  3. After you shampoo, rinse your hair and body thoroughly to remove the shampoo.  4. Use CHG as you would any other liquid soap. You can apply  CHG directly to the skin and wash gently with a scrungie or a clean washcloth.   5. Apply the CHG Soap to your body ONLY FROM THE NECK DOWN.  Do not use on open wounds or open sores. Avoid contact with your eyes, ears, mouth and genitals (private parts). Wash Face and genitals (private parts)  with your normal soap.   6. Wash thoroughly, paying special attention to the area where your surgery will be performed.  7. Thoroughly rinse your body with warm water from the neck down.  8. DO NOT shower/wash with your normal soap after using and rinsing off the CHG  Soap.  9. Pat yourself dry with a CLEAN TOWEL.  10. Wear CLEAN PAJAMAS to bed the night before surgery, wear comfortable clothes the morning of surgery  11. Place CLEAN SHEETS on your bed the night of your first shower and DO NOT SLEEP WITH PETS.    Day of Surgery:  Do not apply any deodorants/lotions.  Please wear clean clothes to the hospital/surgery center.   Remember to brush your teeth WITH YOUR REGULAR TOOTHPASTE.   Please read over the following fact sheets that you were given.

## 2018-12-03 NOTE — Progress Notes (Signed)
   12/03/18 1231  Preop Orders  Has preop orders? No  Name of staff/physician contacted for orders(Indicate phone or IB message) Phone  Name of [place] scheduler notified Jackelyn Poling

## 2018-12-04 ENCOUNTER — Encounter (HOSPITAL_COMMUNITY)
Admission: RE | Admit: 2018-12-04 | Discharge: 2018-12-04 | Disposition: A | Discharge status: Home / Self Care | Payer: Medicare HMO | Source: Ambulatory Visit | Attending: Orthopedic Surgery | Admitting: Orthopedic Surgery

## 2018-12-04 ENCOUNTER — Other Ambulatory Visit: Payer: Self-pay

## 2018-12-04 ENCOUNTER — Encounter (HOSPITAL_COMMUNITY): Payer: Self-pay

## 2018-12-04 DIAGNOSIS — Z791 Long term (current) use of non-steroidal anti-inflammatories (NSAID): Secondary | ICD-10-CM | POA: Diagnosis not present

## 2018-12-04 DIAGNOSIS — Z96612 Presence of left artificial shoulder joint: Secondary | ICD-10-CM | POA: Diagnosis not present

## 2018-12-04 DIAGNOSIS — Z91040 Latex allergy status: Secondary | ICD-10-CM | POA: Diagnosis not present

## 2018-12-04 DIAGNOSIS — Z01818 Encounter for other preprocedural examination: Secondary | ICD-10-CM | POA: Insufficient documentation

## 2018-12-04 DIAGNOSIS — J449 Chronic obstructive pulmonary disease, unspecified: Secondary | ICD-10-CM | POA: Diagnosis present

## 2018-12-04 DIAGNOSIS — F419 Anxiety disorder, unspecified: Secondary | ICD-10-CM | POA: Diagnosis present

## 2018-12-04 DIAGNOSIS — Z79899 Other long term (current) drug therapy: Secondary | ICD-10-CM | POA: Diagnosis not present

## 2018-12-04 DIAGNOSIS — E039 Hypothyroidism, unspecified: Secondary | ICD-10-CM | POA: Diagnosis present

## 2018-12-04 DIAGNOSIS — Z79890 Hormone replacement therapy (postmenopausal): Secondary | ICD-10-CM | POA: Diagnosis not present

## 2018-12-04 DIAGNOSIS — N189 Chronic kidney disease, unspecified: Secondary | ICD-10-CM | POA: Diagnosis present

## 2018-12-04 DIAGNOSIS — S42292G Other displaced fracture of upper end of left humerus, subsequent encounter for fracture with delayed healing: Secondary | ICD-10-CM | POA: Diagnosis not present

## 2018-12-04 DIAGNOSIS — F329 Major depressive disorder, single episode, unspecified: Secondary | ICD-10-CM | POA: Diagnosis present

## 2018-12-04 DIAGNOSIS — Z7982 Long term (current) use of aspirin: Secondary | ICD-10-CM | POA: Diagnosis not present

## 2018-12-04 DIAGNOSIS — S42212A Unspecified displaced fracture of surgical neck of left humerus, initial encounter for closed fracture: Secondary | ICD-10-CM | POA: Diagnosis present

## 2018-12-04 DIAGNOSIS — I2510 Atherosclerotic heart disease of native coronary artery without angina pectoris: Secondary | ICD-10-CM | POA: Diagnosis present

## 2018-12-04 DIAGNOSIS — Z471 Aftercare following joint replacement surgery: Secondary | ICD-10-CM | POA: Diagnosis not present

## 2018-12-04 DIAGNOSIS — Z882 Allergy status to sulfonamides status: Secondary | ICD-10-CM | POA: Diagnosis not present

## 2018-12-04 DIAGNOSIS — Z90710 Acquired absence of both cervix and uterus: Secondary | ICD-10-CM | POA: Diagnosis not present

## 2018-12-04 DIAGNOSIS — I129 Hypertensive chronic kidney disease with stage 1 through stage 4 chronic kidney disease, or unspecified chronic kidney disease: Secondary | ICD-10-CM | POA: Diagnosis present

## 2018-12-04 DIAGNOSIS — Z9049 Acquired absence of other specified parts of digestive tract: Secondary | ICD-10-CM | POA: Diagnosis not present

## 2018-12-04 DIAGNOSIS — Z888 Allergy status to other drugs, medicaments and biological substances status: Secondary | ICD-10-CM | POA: Diagnosis not present

## 2018-12-04 DIAGNOSIS — S42202A Unspecified fracture of upper end of left humerus, initial encounter for closed fracture: Secondary | ICD-10-CM | POA: Diagnosis not present

## 2018-12-04 HISTORY — DX: Anxiety disorder, unspecified: F41.9

## 2018-12-04 HISTORY — DX: Unspecified osteoarthritis, unspecified site: M19.90

## 2018-12-04 HISTORY — DX: Malignant (primary) neoplasm, unspecified: C80.1

## 2018-12-04 HISTORY — DX: Chronic obstructive pulmonary disease, unspecified: J44.9

## 2018-12-04 HISTORY — DX: Atherosclerotic heart disease of native coronary artery without angina pectoris: I25.10

## 2018-12-04 HISTORY — DX: Dyspnea, unspecified: R06.00

## 2018-12-04 HISTORY — DX: Hypothyroidism, unspecified: E03.9

## 2018-12-04 HISTORY — DX: Other complications of anesthesia, initial encounter: T88.59XA

## 2018-12-04 HISTORY — DX: Unspecified urinary incontinence: R32

## 2018-12-04 HISTORY — DX: Depression, unspecified: F32.A

## 2018-12-04 HISTORY — DX: Essential (primary) hypertension: I10

## 2018-12-04 HISTORY — DX: Chronic kidney disease, unspecified: N18.9

## 2018-12-04 LAB — BASIC METABOLIC PANEL
Anion gap: 9 (ref 5–15)
BUN: 18 mg/dL (ref 8–23)
CO2: 24 mmol/L (ref 22–32)
Calcium: 9.7 mg/dL (ref 8.9–10.3)
Chloride: 105 mmol/L (ref 98–111)
Creatinine, Ser: 0.94 mg/dL (ref 0.44–1.00)
GFR calc Af Amer: >60 mL/min (ref >60)
GFR calc non Af Amer: 54 mL/min — ABNORMAL LOW (ref >60)
Glucose, Bld: 97 mg/dL (ref 70–99)
Potassium: 4.4 mmol/L (ref 3.5–5.1)
Sodium: 138 mmol/L (ref 135–145)

## 2018-12-04 LAB — CBC
HCT: 32.9 % — ABNORMAL LOW (ref 36.0–46.0)
Hemoglobin: 10 g/dL — ABNORMAL LOW (ref 12.0–15.0)
MCH: 28.8 pg (ref 26.0–34.0)
MCHC: 30.4 g/dL (ref 30.0–36.0)
MCV: 94.8 fL (ref 80.0–100.0)
Platelets: 278 10*3/uL (ref 150–400)
RBC: 3.47 MIL/uL — ABNORMAL LOW (ref 3.87–5.11)
RDW: 14.2 % (ref 11.5–15.5)
WBC: 9.4 10*3/uL (ref 4.0–10.5)
nRBC: 0 % (ref 0.0–0.2)

## 2018-12-04 LAB — SURGICAL PCR SCREEN
MRSA, PCR: NEGATIVE
Staphylococcus aureus: NEGATIVE

## 2018-12-04 NOTE — Progress Notes (Signed)
[id number] - [place], [place] - [address] [place] [address] Phone: [phone] Fax: [phone]      Your procedure is scheduled on June 25  Report to [facility] Main Entrance "A" at 0700 A.M., and check in at the Admitting office.  Call this number if you have problems the morning of surgery:  [phone]  Call [phone] if you have any questions prior to your surgery date Monday-Friday 8am-4pm    Remember:  Do not eat  Food after midnight  You may drink clear liquids  Until 6:00 AM.Clear liquids include water,clear juice such as apple or cranberry,no juices that have pulp in them.Carbonated beverages such coke, pepsi,sprite ect... You may have clear broth or plain jello,black coffee no cream or milk,clear tea,popsicles    Please complete the pre-surgical drink before 6:00 AM  Nothing to eat or drink after 6 AM       Take these medicines the morning of surgery with A SIP OF WATER  acebutolol (SECTRAL) albuterol (PROVENTIL) if needed amlodipine (NORVASC) esomeprazole (NEXIUM) levothyroxine (SYNTHROID  Follow your surgeon's instructions on when to stop Aspirin.  If no instructions were given by your surgeon then you will need to call the office to get those instructions.    7 days prior to surgery STOP taking any celecoxib (CELEBREX), Aspirin (unless otherwise instructed by your surgeon), Aleve, Naproxen, Ibuprofen, Motrin, Advil, Goody's, BC's, all herbal medications, fish oil, and all vitamins.    The Morning of Surgery  Do not wear jewelry, make-up or nail polish.  Do not wear lotions, powders, or perfumes/colognes, or deodorant  Do not shave 48 hours prior to surgery.  Men may shave face and neck.  Do not bring valuables to the hospital.  [facility] is not responsible for any belongings or valuables.  If you are a smoker, DO NOT Smoke 24 hours prior to surgery IF you wear a CPAP at night please bring your mask, tubing, and  machine the morning of surgery   Remember that you must have someone to transport you home after your surgery, and remain with you for 24 hours if you are discharged the same day.   Contacts, glasses, hearing aids, dentures or bridgework may not be worn into surgery.    Leave your suitcase in the car.  After surgery it may be brought to your room.  For patients admitted to the hospital, discharge time will be determined by your treatment team.  Patients discharged the day of surgery will not be allowed to drive home.    Special instructions:   [place]- Preparing For Surgery  Before surgery, you can play an important role. Because skin is not sterile, your skin needs to be as free of germs as possible. You can reduce the number of germs on your skin by washing with CHG (chlorhexidine gluconate) Soap before surgery.  CHG is an antiseptic cleaner which kills germs and bonds with the skin to continue killing germs even after washing.    Oral Hygiene is also important to reduce your risk of infection.  Remember - BRUSH YOUR TEETH THE MORNING OF SURGERY WITH YOUR REGULAR TOOTHPASTE  Please do not use if you have an allergy to CHG or antibacterial soaps. If your skin becomes reddened/irritated stop using the CHG.  Do not shave (including legs and underarms) for at least 48 hours prior to first CHG shower. It is OK to shave your face.  Please follow these instructions  carefully.   1. Shower the NIGHT BEFORE SURGERY and the MORNING OF SURGERY with CHG Soap.   2. If you chose to wash your hair, wash your hair first as usual with your normal shampoo.  3. After you shampoo, rinse your hair and body thoroughly to remove the shampoo.  4. Use CHG as you would any other liquid soap. You can apply CHG directly to the skin and wash gently with a scrungie or a clean washcloth.   5. Apply the CHG Soap to your body ONLY FROM THE NECK DOWN.  Do not use on open wounds or open sores. Avoid contact  with your eyes, ears, mouth and genitals (private parts). Wash Face and genitals (private parts)  with your normal soap.   6. Wash thoroughly, paying special attention to the area where your surgery will be performed.  7. Thoroughly rinse your body with warm water from the neck down.  8. DO NOT shower/wash with your normal soap after using and rinsing off the CHG Soap.  9. Pat yourself dry with a CLEAN TOWEL.  10. Wear CLEAN PAJAMAS to bed the night before surgery, wear comfortable clothes the morning of surgery  11. Place CLEAN SHEETS on your bed the night of your first shower and DO NOT SLEEP WITH PETS.    Day of Surgery:  Do not apply any deodorants/lotions.  Please wear clean clothes to the hospital/surgery center.   Remember to brush your teeth WITH YOUR REGULAR TOOTHPASTE.   Please read over the following fact sheets that you were given.

## 2018-12-04 NOTE — Progress Notes (Signed)
Pt unable to give urine sample,states she will bring in the morning of surgery.  Pt. States understanding,.

## 2018-12-04 NOTE — Progress Notes (Signed)
PCP - [name]  MD Cardiologist - none  Chest x-ray -  EKG -  Stress Test -2004  ECHO -2005  Cardiac Cath - 10-26-2018  Sleep Study -  CPAP -   Fasting Blood Sugar -  Checks Blood Sugar _____ times a day  Blood Thinner Instructions: Aspirin Instructions:  Anesthesia review:   Patient denies shortness of breath, fever, cough and chest pain at PAT appointment   Patient verbalized understanding of instructions that were given to them at the PAT appointment. Patient was also instructed that they will need to review over the PAT instructions again at home before surgery.

## 2018-12-05 ENCOUNTER — Encounter (HOSPITAL_COMMUNITY): Payer: Self-pay

## 2018-12-05 NOTE — Anesthesia Preprocedure Evaluation (Addendum)
Anesthesia Evaluation  Patient identified by MRN, date of birth, ID band Patient awake    Reviewed: Allergy & Precautions, NPO status , Patient's Chart, lab work & pertinent test results  Airway Mallampati: III  TM Distance: >3 FB Neck ROM: Full    Dental  (+) Chipped,    Pulmonary shortness of breath and at rest, COPD,  COPD inhaler,     + decreased breath sounds      Cardiovascular hypertension, Pt. on medications + CAD  Normal cardiovascular exam Rhythm:Regular Rate:Normal  ECG: rate 66. Sinus rhythm with marked sinus arrhythmia   Neuro/Psych  Headaches, PSYCHIATRIC DISORDERS Anxiety Depression    GI/Hepatic Neg liver ROS, GERD  Medicated and Controlled,  Endo/Other  Hypothyroidism   Renal/GU Renal disease     Musculoskeletal  (+) Arthritis ,   Abdominal (+) + obese,   Peds  Hematology  (+) anemia , HLD   Anesthesia Other Findings left proximal humerus fracture  Reproductive/Obstetrics                          Anesthesia Physical Anesthesia Plan  ASA: III  Anesthesia Plan: General and Regional   Post-op Pain Management: GA combined w/ Regional for post-op pain   Induction: Intravenous  PONV Risk Score and Plan: 3 and Ondansetron, Dexamethasone and Treatment may vary due to age or medical condition  Airway Management Planned: Oral ETT  Additional Equipment:   Intra-op Plan:   Post-operative Plan: Extubation in OR  Informed Consent: I have reviewed the patients History and Physical, chart, labs and discussed the procedure including the risks, benefits and alternatives for the proposed anesthesia with the patient or authorized representative who has indicated his/her understanding and acceptance.     Dental advisory given  Plan Discussed with: CRNA  Anesthesia Plan Comments: (Reviewed PAT note written 12/05/2018 by Myra Gianotti, PA-C. )      Anesthesia Quick  Evaluation
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
## 2018-12-06 ENCOUNTER — Inpatient Hospital Stay (HOSPITAL_COMMUNITY)
Admission: RE | Admit: 2018-12-06 | Discharge: 2018-12-10 | DRG: 483 | Disposition: A | Discharge status: Home Health | Payer: Medicare HMO | Attending: Orthopedic Surgery | Admitting: Orthopedic Surgery

## 2018-12-06 ENCOUNTER — Encounter (HOSPITAL_COMMUNITY): Payer: Self-pay

## 2018-12-06 ENCOUNTER — Inpatient Hospital Stay (HOSPITAL_COMMUNITY): Payer: Medicare HMO | Admitting: Certified Registered Nurse Anesthetist

## 2018-12-06 ENCOUNTER — Encounter (HOSPITAL_COMMUNITY)
Admission: RE | Disposition: A | Discharge status: Home / Self Care | Payer: Self-pay | Source: Home / Self Care | Attending: Orthopedic Surgery

## 2018-12-06 ENCOUNTER — Inpatient Hospital Stay (HOSPITAL_COMMUNITY): Payer: Medicare HMO

## 2018-12-06 ENCOUNTER — Other Ambulatory Visit: Payer: Self-pay

## 2018-12-06 ENCOUNTER — Inpatient Hospital Stay (HOSPITAL_COMMUNITY): Payer: Medicare HMO | Admitting: Vascular Surgery

## 2018-12-06 DIAGNOSIS — N189 Chronic kidney disease, unspecified: Secondary | ICD-10-CM | POA: Diagnosis present

## 2018-12-06 DIAGNOSIS — I129 Hypertensive chronic kidney disease with stage 1 through stage 4 chronic kidney disease, or unspecified chronic kidney disease: Secondary | ICD-10-CM | POA: Diagnosis present

## 2018-12-06 DIAGNOSIS — Z882 Allergy status to sulfonamides status: Secondary | ICD-10-CM

## 2018-12-06 DIAGNOSIS — S42292G Other displaced fracture of upper end of left humerus, subsequent encounter for fracture with delayed healing: Secondary | ICD-10-CM | POA: Diagnosis not present

## 2018-12-06 DIAGNOSIS — Z9049 Acquired absence of other specified parts of digestive tract: Secondary | ICD-10-CM

## 2018-12-06 DIAGNOSIS — Z888 Allergy status to other drugs, medicaments and biological substances status: Secondary | ICD-10-CM

## 2018-12-06 DIAGNOSIS — I2510 Atherosclerotic heart disease of native coronary artery without angina pectoris: Secondary | ICD-10-CM | POA: Diagnosis present

## 2018-12-06 DIAGNOSIS — E039 Hypothyroidism, unspecified: Secondary | ICD-10-CM | POA: Diagnosis present

## 2018-12-06 DIAGNOSIS — Z791 Long term (current) use of non-steroidal anti-inflammatories (NSAID): Secondary | ICD-10-CM

## 2018-12-06 DIAGNOSIS — Z91040 Latex allergy status: Secondary | ICD-10-CM

## 2018-12-06 DIAGNOSIS — Z7982 Long term (current) use of aspirin: Secondary | ICD-10-CM

## 2018-12-06 DIAGNOSIS — Z79899 Other long term (current) drug therapy: Secondary | ICD-10-CM | POA: Diagnosis not present

## 2018-12-06 DIAGNOSIS — Z79890 Hormone replacement therapy (postmenopausal): Secondary | ICD-10-CM

## 2018-12-06 DIAGNOSIS — S42212A Unspecified displaced fracture of surgical neck of left humerus, initial encounter for closed fracture: Principal | ICD-10-CM | POA: Diagnosis present

## 2018-12-06 DIAGNOSIS — F329 Major depressive disorder, single episode, unspecified: Secondary | ICD-10-CM | POA: Diagnosis present

## 2018-12-06 DIAGNOSIS — Z90710 Acquired absence of both cervix and uterus: Secondary | ICD-10-CM | POA: Diagnosis not present

## 2018-12-06 DIAGNOSIS — F419 Anxiety disorder, unspecified: Secondary | ICD-10-CM | POA: Diagnosis present

## 2018-12-06 DIAGNOSIS — S42209A Unspecified fracture of upper end of unspecified humerus, initial encounter for closed fracture: Secondary | ICD-10-CM | POA: Diagnosis present

## 2018-12-06 DIAGNOSIS — J449 Chronic obstructive pulmonary disease, unspecified: Secondary | ICD-10-CM | POA: Diagnosis present

## 2018-12-06 HISTORY — PX: REVERSE SHOULDER ARTHROPLASTY: SHX5054

## 2018-12-06 LAB — CBC
HCT: 29.9 % — ABNORMAL LOW (ref 36.0–46.0)
Hemoglobin: 9.5 g/dL — ABNORMAL LOW (ref 12.0–15.0)
MCH: 29 pg (ref 26.0–34.0)
MCHC: 31.8 g/dL (ref 30.0–36.0)
MCV: 91.2 fL (ref 80.0–100.0)
Platelets: 244 10*3/uL (ref 150–400)
RBC: 3.28 MIL/uL — ABNORMAL LOW (ref 3.87–5.11)
RDW: 14.1 % (ref 11.5–15.5)
WBC: 16.7 10*3/uL — ABNORMAL HIGH (ref 4.0–10.5)
nRBC: 0 % (ref 0.0–0.2)

## 2018-12-06 LAB — URINALYSIS, ROUTINE W REFLEX MICROSCOPIC
Bilirubin Urine: NEGATIVE
Glucose, UA: NEGATIVE mg/dL
Hgb urine dipstick: NEGATIVE
Ketones, ur: NEGATIVE mg/dL
Nitrite: POSITIVE — AB
Protein, ur: NEGATIVE mg/dL
Specific Gravity, Urine: 1.009 (ref 1.005–1.030)
WBC, UA: >50 WBC/hpf — ABNORMAL HIGH (ref 0–5)
pH: 5 (ref 5.0–8.0)

## 2018-12-06 LAB — HEMOGLOBIN AND HEMATOCRIT, BLOOD
HCT: 30.2 % — ABNORMAL LOW (ref 36.0–46.0)
Hemoglobin: 9.3 g/dL — ABNORMAL LOW (ref 12.0–15.0)

## 2018-12-06 SURGERY — ARTHROPLASTY, SHOULDER, TOTAL, REVERSE
Anesthesia: General | Site: Shoulder | Laterality: Left

## 2018-12-06 MED ORDER — ACETAMINOPHEN 325 MG PO TABS
325.0000 mg | ORAL_TABLET | Freq: Four times a day (QID) | ORAL | Status: DC | PRN
  Administered 2018-12-07 – 2018-12-10 (×2): 650 mg via ORAL
  Filled 2018-12-06 (×2): qty 2

## 2018-12-06 MED ORDER — ATORVASTATIN CALCIUM 80 MG PO TABS
80.0000 mg | ORAL_TABLET | Freq: Every day | ORAL | Status: DC
  Administered 2018-12-06 – 2018-12-09 (×4): 80 mg via ORAL
  Filled 2018-12-06 (×4): qty 1

## 2018-12-06 MED ORDER — HYDROCODONE-ACETAMINOPHEN 5-325 MG PO TABS
1.0000 | ORAL_TABLET | ORAL | Status: DC | PRN
  Administered 2018-12-06 – 2018-12-09 (×7): 1 via ORAL
  Administered 2018-12-09: 2 via ORAL
  Filled 2018-12-06 (×6): qty 1
  Filled 2018-12-06: qty 2
  Filled 2018-12-06: qty 1

## 2018-12-06 MED ORDER — CHLORHEXIDINE GLUCONATE 4 % EX LIQD: 60.0000 mL | Freq: Once | CUTANEOUS | Status: DC

## 2018-12-06 MED ORDER — ROPINIROLE HCL 0.5 MG PO TABS
0.5000 mg | ORAL_TABLET | Freq: Every day | ORAL | Status: DC
  Administered 2018-12-06 – 2018-12-09 (×4): 0.5 mg via ORAL
  Filled 2018-12-06 (×4): qty 1

## 2018-12-06 MED ORDER — CLONIDINE HCL (ANALGESIA) 100 MCG/ML EP SOLN
EPIDURAL | Status: DC | PRN
  Administered 2018-12-06: 1 mL

## 2018-12-06 MED ORDER — ONDANSETRON HCL 4 MG/2ML IJ SOLN
INTRAMUSCULAR | Status: DC | PRN
  Administered 2018-12-06: 4 mg via INTRAVENOUS

## 2018-12-06 MED ORDER — ROCURONIUM BROMIDE 10 MG/ML (PF) SYRINGE
PREFILLED_SYRINGE | INTRAVENOUS | Status: DC | PRN
  Administered 2018-12-06 (×2): 30 mg via INTRAVENOUS

## 2018-12-06 MED ORDER — METOCLOPRAMIDE HCL 5 MG/ML IJ SOLN: 5.0000 mg | Freq: Three times a day (TID) | INTRAMUSCULAR | Status: DC | PRN

## 2018-12-06 MED ORDER — ONDANSETRON HCL 4 MG/2ML IJ SOLN: 4.0000 mg | Freq: Four times a day (QID) | INTRAMUSCULAR | Status: DC | PRN

## 2018-12-06 MED ORDER — 0.9 % SODIUM CHLORIDE (POUR BTL) OPTIME
TOPICAL | Status: DC | PRN
  Administered 2018-12-06 (×2): 1000 mL

## 2018-12-06 MED ORDER — DOCUSATE SODIUM 100 MG PO CAPS
100.0000 mg | ORAL_CAPSULE | Freq: Two times a day (BID) | ORAL | Status: DC
  Administered 2018-12-06 – 2018-12-10 (×9): 100 mg via ORAL
  Filled 2018-12-06 (×9): qty 1

## 2018-12-06 MED ORDER — CLONIDINE HCL (ANALGESIA) 100 MCG/ML EP SOLN
EPIDURAL | Status: AC
  Filled 2018-12-06: qty 10

## 2018-12-06 MED ORDER — HYDROCODONE-ACETAMINOPHEN 5-325 MG PO TABS
ORAL_TABLET | ORAL | Status: AC
  Filled 2018-12-06: qty 1

## 2018-12-06 MED ORDER — BUPIVACAINE HCL (PF) 0.25 % IJ SOLN
INTRAMUSCULAR | Status: AC
  Filled 2018-12-06: qty 30

## 2018-12-06 MED ORDER — OLANZAPINE 5 MG PO TABS
10.0000 mg | ORAL_TABLET | Freq: Every day | ORAL | Status: DC
  Administered 2018-12-06 – 2018-12-09 (×4): 10 mg via ORAL
  Filled 2018-12-06 (×2): qty 1
  Filled 2018-12-06 (×2): qty 2
  Filled 2018-12-06 (×2): qty 1
  Filled 2018-12-06: qty 2

## 2018-12-06 MED ORDER — FUROSEMIDE 20 MG PO TABS: 20.0000 mg | ORAL_TABLET | ORAL | Status: DC

## 2018-12-06 MED ORDER — EPHEDRINE SULFATE-NACL 50-0.9 MG/10ML-% IV SOSY
PREFILLED_SYRINGE | INTRAVENOUS | Status: DC | PRN
  Administered 2018-12-06 (×5): 5 mg via INTRAVENOUS
  Administered 2018-12-06 (×2): 10 mg via INTRAVENOUS
  Administered 2018-12-06: 5 mg via INTRAVENOUS

## 2018-12-06 MED ORDER — TRANEXAMIC ACID 1000 MG/10ML IV SOLN
2000.0000 mg | Freq: Once | INTRAVENOUS | Status: AC
  Administered 2018-12-06: 2000 mg via TOPICAL
  Filled 2018-12-06: qty 20

## 2018-12-06 MED ORDER — METHOCARBAMOL 500 MG PO TABS
500.0000 mg | ORAL_TABLET | Freq: Four times a day (QID) | ORAL | Status: DC | PRN
  Administered 2018-12-06: 13:00:00 500 mg via ORAL

## 2018-12-06 MED ORDER — MORPHINE SULFATE (PF) 4 MG/ML IV SOLN
INTRAVENOUS | Status: DC | PRN
  Administered 2018-12-06: 8 mg

## 2018-12-06 MED ORDER — LACTATED RINGERS IV SOLN
INTRAVENOUS | Status: DC | PRN
  Administered 2018-12-06 (×2): via INTRAVENOUS

## 2018-12-06 MED ORDER — DEXAMETHASONE SODIUM PHOSPHATE 10 MG/ML IJ SOLN
INTRAMUSCULAR | Status: DC | PRN
  Administered 2018-12-06: 4 mg via INTRAVENOUS

## 2018-12-06 MED ORDER — LIDOCAINE 2% (20 MG/ML) 5 ML SYRINGE
INTRAMUSCULAR | Status: AC
  Filled 2018-12-06: qty 5

## 2018-12-06 MED ORDER — POTASSIUM CHLORIDE CRYS ER 20 MEQ PO TBCR
20.0000 meq | EXTENDED_RELEASE_TABLET | Freq: Every morning | ORAL | Status: DC
  Administered 2018-12-07 – 2018-12-10 (×4): 20 meq via ORAL
  Filled 2018-12-06 (×4): qty 1

## 2018-12-06 MED ORDER — DIPHENHYDRAMINE HCL 25 MG PO TABS
25.0000 mg | ORAL_TABLET | Freq: Four times a day (QID) | ORAL | Status: DC | PRN
  Filled 2018-12-06: qty 1

## 2018-12-06 MED ORDER — ALBUTEROL SULFATE (2.5 MG/3ML) 0.083% IN NEBU: 2.5000 mg | INHALATION_SOLUTION | Freq: Four times a day (QID) | RESPIRATORY_TRACT | Status: DC | PRN

## 2018-12-06 MED ORDER — METHOCARBAMOL 1000 MG/10ML IJ SOLN
500.0000 mg | Freq: Four times a day (QID) | INTRAVENOUS | Status: DC | PRN
  Filled 2018-12-06: qty 5

## 2018-12-06 MED ORDER — ROCURONIUM BROMIDE 10 MG/ML (PF) SYRINGE
PREFILLED_SYRINGE | INTRAVENOUS | Status: AC
  Filled 2018-12-06: qty 10

## 2018-12-06 MED ORDER — HYDROCODONE-ACETAMINOPHEN 5-325 MG PO TABS
1.0000 | ORAL_TABLET | ORAL | Status: DC | PRN
  Administered 2018-12-06: 1 via ORAL

## 2018-12-06 MED ORDER — ACEBUTOLOL HCL 200 MG PO CAPS
200.0000 mg | ORAL_CAPSULE | Freq: Every day | ORAL | Status: DC
  Administered 2018-12-07 – 2018-12-10 (×4): 200 mg via ORAL
  Filled 2018-12-06 (×4): qty 1

## 2018-12-06 MED ORDER — TRANEXAMIC ACID-NACL 1000-0.7 MG/100ML-% IV SOLN
INTRAVENOUS | Status: AC
  Filled 2018-12-06: qty 100

## 2018-12-06 MED ORDER — FENTANYL CITRATE (PF) 250 MCG/5ML IJ SOLN
INTRAMUSCULAR | Status: AC
  Filled 2018-12-06: qty 5

## 2018-12-06 MED ORDER — MENTHOL 3 MG MT LOZG: 1.0000 | LOZENGE | OROMUCOSAL | Status: DC | PRN

## 2018-12-06 MED ORDER — PANTOPRAZOLE SODIUM 40 MG PO TBEC
40.0000 mg | DELAYED_RELEASE_TABLET | Freq: Every day | ORAL | Status: DC
  Administered 2018-12-06 – 2018-12-10 (×5): 40 mg via ORAL
  Filled 2018-12-06 (×5): qty 1

## 2018-12-06 MED ORDER — PROPOFOL 10 MG/ML IV BOLUS
INTRAVENOUS | Status: DC | PRN
  Administered 2018-12-06: 50 mg via INTRAVENOUS
  Administered 2018-12-06: 150 mg via INTRAVENOUS

## 2018-12-06 MED ORDER — BUPIVACAINE HCL 0.25 % IJ SOLN
INTRAMUSCULAR | Status: DC | PRN
  Administered 2018-12-06: 20 mL

## 2018-12-06 MED ORDER — SUGAMMADEX SODIUM 200 MG/2ML IV SOLN
INTRAVENOUS | Status: DC | PRN
  Administered 2018-12-06: 200 mg via INTRAVENOUS

## 2018-12-06 MED ORDER — METOCLOPRAMIDE HCL 5 MG PO TABS: 5.0000 mg | ORAL_TABLET | Freq: Three times a day (TID) | ORAL | Status: DC | PRN

## 2018-12-06 MED ORDER — AMLODIPINE BESYLATE 2.5 MG PO TABS
2.5000 mg | ORAL_TABLET | Freq: Every day | ORAL | Status: DC
  Administered 2018-12-07 – 2018-12-10 (×4): 2.5 mg via ORAL
  Filled 2018-12-06 (×4): qty 1

## 2018-12-06 MED ORDER — GABAPENTIN 300 MG PO CAPS
300.0000 mg | ORAL_CAPSULE | Freq: Three times a day (TID) | ORAL | Status: DC
  Administered 2018-12-06 – 2018-12-10 (×12): 300 mg via ORAL
  Filled 2018-12-06 (×12): qty 1

## 2018-12-06 MED ORDER — ADULT MULTIVITAMIN W/MINERALS CH
1.0000 | ORAL_TABLET | Freq: Every day | ORAL | Status: DC
  Administered 2018-12-06 – 2018-12-10 (×5): 1 via ORAL
  Filled 2018-12-06 (×5): qty 1

## 2018-12-06 MED ORDER — VANCOMYCIN HCL 1000 MG IV SOLR
INTRAVENOUS | Status: AC
  Filled 2018-12-06: qty 1000

## 2018-12-06 MED ORDER — SODIUM CHLORIDE 0.9 % IV SOLN
INTRAVENOUS | Status: DC | PRN
  Administered 2018-12-06: 60 ug/min via INTRAVENOUS

## 2018-12-06 MED ORDER — ONDANSETRON HCL 4 MG/2ML IJ SOLN: 4.0000 mg | Freq: Once | INTRAMUSCULAR | Status: DC | PRN

## 2018-12-06 MED ORDER — NON FORMULARY
Status: DC | PRN
  Administered 2018-12-06: 450 mL via TOPICAL

## 2018-12-06 MED ORDER — ACETAMINOPHEN 500 MG PO TABS
1000.0000 mg | ORAL_TABLET | Freq: Once | ORAL | Status: AC
  Administered 2018-12-06: 07:00:00 1000 mg via ORAL
  Filled 2018-12-06: qty 2

## 2018-12-06 MED ORDER — PHENYLEPHRINE HCL-NACL 10-0.9 MG/250ML-% IV SOLN
INTRAVENOUS | Status: AC
  Filled 2018-12-06: qty 250

## 2018-12-06 MED ORDER — CEFAZOLIN SODIUM-DEXTROSE 2-4 GM/100ML-% IV SOLN
2.0000 g | INTRAVENOUS | Status: AC
  Administered 2018-12-06: 2 g via INTRAVENOUS
  Filled 2018-12-06: qty 100

## 2018-12-06 MED ORDER — METHOCARBAMOL 500 MG PO TABS
ORAL_TABLET | ORAL | Status: AC
  Filled 2018-12-06: qty 1

## 2018-12-06 MED ORDER — MORPHINE SULFATE (PF) 2 MG/ML IV SOLN: 0.5000 mg | INTRAVENOUS | Status: DC | PRN

## 2018-12-06 MED ORDER — VITAMIN D 25 MCG (1000 UNIT) PO TABS
1000.0000 [IU] | ORAL_TABLET | Freq: Every day | ORAL | Status: DC
  Administered 2018-12-07 – 2018-12-10 (×4): 1000 [IU] via ORAL
  Filled 2018-12-06 (×5): qty 1

## 2018-12-06 MED ORDER — ONDANSETRON HCL 4 MG PO TABS: 4.0000 mg | ORAL_TABLET | Freq: Four times a day (QID) | ORAL | Status: DC | PRN

## 2018-12-06 MED ORDER — FUROSEMIDE 40 MG PO TABS
40.0000 mg | ORAL_TABLET | Freq: Every day | ORAL | Status: DC
  Administered 2018-12-07 – 2018-12-10 (×4): 40 mg via ORAL
  Filled 2018-12-06 (×4): qty 1

## 2018-12-06 MED ORDER — LEVOTHYROXINE SODIUM 100 MCG PO TABS
100.0000 ug | ORAL_TABLET | Freq: Every day | ORAL | Status: DC
  Administered 2018-12-07 – 2018-12-10 (×4): 100 ug via ORAL
  Filled 2018-12-06 (×4): qty 1

## 2018-12-06 MED ORDER — TRANEXAMIC ACID-NACL 1000-0.7 MG/100ML-% IV SOLN
INTRAVENOUS | Status: DC | PRN
  Administered 2018-12-06: 1000 mg via INTRAVENOUS

## 2018-12-06 MED ORDER — PHENOL 1.4 % MT LIQD: 1.0000 | OROMUCOSAL | Status: DC | PRN

## 2018-12-06 MED ORDER — ONDANSETRON HCL 4 MG/2ML IJ SOLN
INTRAMUSCULAR | Status: AC
  Filled 2018-12-06: qty 2

## 2018-12-06 MED ORDER — ASPIRIN 81 MG PO CHEW
81.0000 mg | CHEWABLE_TABLET | Freq: Every day | ORAL | Status: DC
  Administered 2018-12-06 – 2018-12-10 (×5): 81 mg via ORAL
  Filled 2018-12-06 (×5): qty 1

## 2018-12-06 MED ORDER — PROPOFOL 10 MG/ML IV BOLUS
INTRAVENOUS | Status: AC
  Filled 2018-12-06: qty 20

## 2018-12-06 MED ORDER — MORPHINE SULFATE (PF) 4 MG/ML IV SOLN
INTRAVENOUS | Status: AC
  Filled 2018-12-06: qty 2

## 2018-12-06 MED ORDER — LACTATED RINGERS IV SOLN
INTRAVENOUS | Status: AC
  Administered 2018-12-06: 13:00:00 via INTRAVENOUS

## 2018-12-06 MED ORDER — FENTANYL CITRATE (PF) 250 MCG/5ML IJ SOLN
INTRAMUSCULAR | Status: DC | PRN
  Administered 2018-12-06: 100 ug via INTRAVENOUS
  Administered 2018-12-06 (×2): 25 ug via INTRAVENOUS
  Administered 2018-12-06: 50 ug via INTRAVENOUS

## 2018-12-06 MED ORDER — CEFAZOLIN SODIUM-DEXTROSE 2-4 GM/100ML-% IV SOLN
2.0000 g | Freq: Four times a day (QID) | INTRAVENOUS | Status: AC
  Administered 2018-12-06 (×2): 2 g via INTRAVENOUS
  Filled 2018-12-06 (×2): qty 100

## 2018-12-06 MED ORDER — FUROSEMIDE 20 MG PO TABS
20.0000 mg | ORAL_TABLET | Freq: Every day | ORAL | Status: DC
  Administered 2018-12-07 – 2018-12-09 (×3): 20 mg via ORAL
  Filled 2018-12-06 (×3): qty 1

## 2018-12-06 MED ORDER — FENTANYL CITRATE (PF) 100 MCG/2ML IJ SOLN: 25.0000 ug | INTRAMUSCULAR | Status: DC | PRN

## 2018-12-06 MED ORDER — VANCOMYCIN HCL 1000 MG IV SOLR
INTRAVENOUS | Status: DC | PRN
  Administered 2018-12-06: 1000 mg via TOPICAL

## 2018-12-06 MED ORDER — SODIUM CHLORIDE 0.9 % IR SOLN
Status: DC | PRN
  Administered 2018-12-06: 1000 mL

## 2018-12-06 MED ORDER — PHENYLEPHRINE 40 MCG/ML (10ML) SYRINGE FOR IV PUSH (FOR BLOOD PRESSURE SUPPORT)
PREFILLED_SYRINGE | INTRAVENOUS | Status: DC | PRN
  Administered 2018-12-06 (×4): 80 ug via INTRAVENOUS
  Administered 2018-12-06: 40 ug via INTRAVENOUS
  Administered 2018-12-06 (×3): 80 ug via INTRAVENOUS

## 2018-12-06 MED ORDER — DEXAMETHASONE SODIUM PHOSPHATE 10 MG/ML IJ SOLN
INTRAMUSCULAR | Status: AC
  Filled 2018-12-06: qty 1

## 2018-12-06 MED ORDER — METHOCARBAMOL 500 MG PO TABS: 500.0000 mg | ORAL_TABLET | Freq: Four times a day (QID) | ORAL | Status: DC | PRN

## 2018-12-06 SURGICAL SUPPLY — 88 items
ALCOHOL 70% 16 OZ (MISCELLANEOUS) ×3 IMPLANT
BAG DECANTER FOR FLEXI CONT (MISCELLANEOUS) ×2 IMPLANT
BASEPLATE GLENOSPHERE 25 (Plate) ×1 IMPLANT
BASEPLATE GLENOSPHERE 25MM (Plate) ×1 IMPLANT
BIT DRILL TWIST 2.7 (BIT) ×1 IMPLANT
BIT DRILL TWIST 2.7MM (BIT) ×1
BLADE SAW SGTL 13X75X1.27 (BLADE) ×3 IMPLANT
BOWL SMART MIX CTS (DISPOSABLE) ×2 IMPLANT
CABLE CERLAGE W/CRIMP 1.8 (Cable) ×1 IMPLANT
CABLE CERLAGE W/CRIMP 1.8MM (Cable) ×1 IMPLANT
CEMENT BONE REFOBACIN R1X40 US (Cement) ×2 IMPLANT
CLOSURE WOUND 1/2 X4 (GAUZE/BANDAGES/DRESSINGS) ×1
CONT SPEC 4OZ CLIKSEAL STRL BL (MISCELLANEOUS) ×2 IMPLANT
COVER SURGICAL LIGHT HANDLE (MISCELLANEOUS) ×3 IMPLANT
COVER WAND RF STERILE (DRAPES) ×1 IMPLANT
DRAPE INCISE IOBAN 66X45 STRL (DRAPES) ×3 IMPLANT
DRAPE U-SHAPE 47X51 STRL (DRAPES) ×6 IMPLANT
DRSG AQUACEL AG ADV 3.5X10 (GAUZE/BANDAGES/DRESSINGS) ×3 IMPLANT
ELECT BLADE 4.0 EZ CLEAN MEGAD (MISCELLANEOUS) ×3
ELECT REM PT RETURN 9FT ADLT (ELECTROSURGICAL) ×3
ELECTRODE BLDE 4.0 EZ CLN MEGD (MISCELLANEOUS) ×1 IMPLANT
ELECTRODE REM PT RTRN 9FT ADLT (ELECTROSURGICAL) ×1 IMPLANT
GAUZE SPONGE 4X4 12PLY STRL LF (GAUZE/BANDAGES/DRESSINGS) ×3 IMPLANT
GLENOID SPHERE 36MM CVD +3 (Orthopedic Implant) ×2 IMPLANT
GLOVE BIOGEL PI IND STRL 7.0 (GLOVE) IMPLANT
GLOVE BIOGEL PI IND STRL 7.5 (GLOVE) ×1 IMPLANT
GLOVE BIOGEL PI IND STRL 8 (GLOVE) ×1 IMPLANT
GLOVE BIOGEL PI INDICATOR 7.0 (GLOVE) ×4
GLOVE BIOGEL PI INDICATOR 7.5 (GLOVE) ×4
GLOVE BIOGEL PI INDICATOR 8 (GLOVE) ×2
GLOVE SURG SS PI 6.5 STRL IVOR (GLOVE) ×6 IMPLANT
GLOVE SURG SS PI 7.0 STRL IVOR (GLOVE) ×4 IMPLANT
GLOVE SURG SS PI 8.0 STRL IVOR (GLOVE) ×4 IMPLANT
GOWN STRL REUS W/ TWL LRG LVL3 (GOWN DISPOSABLE) ×2 IMPLANT
GOWN STRL REUS W/ TWL XL LVL3 (GOWN DISPOSABLE) IMPLANT
GOWN STRL REUS W/TWL LRG LVL3 (GOWN DISPOSABLE) ×6
GOWN STRL REUS W/TWL XL LVL3 (GOWN DISPOSABLE) ×2
HYDROGEN PEROXIDE 16OZ (MISCELLANEOUS) ×3 IMPLANT
JET LAVAGE IRRISEPT WOUND (IRRIGATION / IRRIGATOR) ×3
KIT BASIN OR (CUSTOM PROCEDURE TRAY) ×3 IMPLANT
KIT TURNOVER KIT B (KITS) ×3 IMPLANT
LAVAGE JET IRRISEPT WOUND (IRRIGATION / IRRIGATOR) IMPLANT
LOOP VESSEL MAXI BLUE (MISCELLANEOUS) ×3 IMPLANT
MANIFOLD NEPTUNE II (INSTRUMENTS) ×3 IMPLANT
NDL 18GX1X1/2 (RX/OR ONLY) (NEEDLE) IMPLANT
NDL SUT 6 .5 CRC .975X.05 MAYO (NEEDLE) ×1 IMPLANT
NEEDLE 18GX1X1/2 (RX/OR ONLY) (NEEDLE) ×3 IMPLANT
NEEDLE MAYO TAPER (NEEDLE) ×2
NS IRRIG 1000ML POUR BTL (IV SOLUTION) ×3 IMPLANT
PACK SHOULDER (CUSTOM PROCEDURE TRAY) ×3 IMPLANT
PAD ARMBOARD 7.5X6 YLW CONV (MISCELLANEOUS) ×6 IMPLANT
PIN HUMERAL STMN 3.2MMX9IN (INSTRUMENTS) ×2 IMPLANT
PIN STEINMANN THREADED TIP (PIN) ×2 IMPLANT
PIN THREADED REVERSE (PIN) ×2 IMPLANT
RETRIEVER SUT HEWSON (MISCELLANEOUS) ×3 IMPLANT
SCREW BONE LOCKING 4.75X30X3.5 (Screw) ×2 IMPLANT
SCREW BONE STRL 6.5MMX25MM (Screw) ×2 IMPLANT
SCREW LOCKING 4.75MMX15MM (Screw) ×4 IMPLANT
SCREW LOCKING STRL 4.75X25X3.5 (Screw) ×2 IMPLANT
SEALER BIPOLAR AQUA 6.0 (INSTRUMENTS) ×2 IMPLANT
SOLUTION BETADINE 4OZ (MISCELLANEOUS) ×3 IMPLANT
SPONGE LAP 18X18 RF (DISPOSABLE) ×3 IMPLANT
STEM HUMERAL STRL 12MMX83MM (Stem) ×2 IMPLANT
STRIP CLOSURE SKIN 1/2X4 (GAUZE/BANDAGES/DRESSINGS) ×2 IMPLANT
SUCTION FRAZIER HANDLE 10FR (MISCELLANEOUS) ×2
SUCTION TUBE FRAZIER 10FR DISP (MISCELLANEOUS) ×1 IMPLANT
SUT BROADBAND TAPE 2PK 1.5 (SUTURE) ×2 IMPLANT
SUT FIBERWIRE #2 38 T-5 BLUE (SUTURE) ×3
SUT MAXBRAID (SUTURE) ×8 IMPLANT
SUT MNCRL AB 3-0 PS2 18 (SUTURE) ×1 IMPLANT
SUT MON AB 3-0 SH 27 (SUTURE) ×2
SUT MON AB 3-0 SH27 (SUTURE) IMPLANT
SUT SILK 2 0 TIES 10X30 (SUTURE) ×3 IMPLANT
SUT VIC AB 0 CT1 27 (SUTURE) ×8
SUT VIC AB 0 CT1 27XBRD ANBCTR (SUTURE) ×4 IMPLANT
SUT VIC AB 1 CT1 27 (SUTURE) ×6
SUT VIC AB 1 CT1 27XBRD ANBCTR (SUTURE) ×2 IMPLANT
SUT VIC AB 2-0 CT1 27 (SUTURE) ×8
SUT VIC AB 2-0 CT1 TAPERPNT 27 (SUTURE) ×3 IMPLANT
SUT VICRYL 0 AB UR-6 (SUTURE) ×9 IMPLANT
SUTURE FIBERWR #2 38 T-5 BLUE (SUTURE) IMPLANT
SYR 20CC LL (SYRINGE) ×2 IMPLANT
SYR 3ML LL SCALE MARK (SYRINGE) ×2 IMPLANT
SYR 5ML LL (SYRINGE) ×2 IMPLANT
TOWEL OR 17X26 10 PK STRL BLUE (TOWEL DISPOSABLE) ×3 IMPLANT
TRAY FOLEY W/BAG SLVR 14FR (SET/KITS/TRAYS/PACK) IMPLANT
TRAY HUM REV SHOULDER 36 +3 (Shoulder) ×2 IMPLANT
TRAY HUM REV SHOULDER STD +6 (Shoulder) ×2 IMPLANT

## 2018-12-06 NOTE — Transfer of Care (Signed)
Immediate Anesthesia Transfer of Care Note  Patient: [name]  Procedure(s) Performed: LEFT REVERSE SHOULDER ARTHROPLASTY (Left Shoulder)  Patient Location: PACU  Anesthesia Type:General  Level of Consciousness: drowsy  Airway & Oxygen Therapy: Patient Spontanous Breathing and Patient connected to face mask oxygen  Post-op Assessment: Report given to RN and Post -op Vital signs reviewed and stable  Post vital signs: Reviewed and stable  Last Vitals:  Vitals Value Taken Time  BP 121/45 12/06/18 1143  Temp    Pulse 72 12/06/18 1146  Resp 18 12/06/18 1146  SpO2 93 % 12/06/18 1146  Vitals shown include unvalidated device data.  Last Pain:  Vitals:   12/06/18 0634  PainSc: 8       Patients Stated Pain Goal: 4 (46/95/07 2257)  Complications: No apparent anesthesia complications
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
## 2018-12-06 NOTE — Progress Notes (Signed)
Called on call MD due to patient not having any pain medication PRN.

## 2018-12-06 NOTE — Anesthesia Postprocedure Evaluation (Signed)
Anesthesia Post Note  Patient: [name]  Procedure(s) Performed: LEFT REVERSE SHOULDER ARTHROPLASTY (Left Shoulder)     Patient location during evaluation: PACU Anesthesia Type: General Level of consciousness: awake Pain management: pain level controlled Vital Signs Assessment: post-procedure vital signs reviewed and stable Respiratory status: spontaneous breathing, nonlabored ventilation, respiratory function stable and patient connected to nasal cannula oxygen Cardiovascular status: blood pressure returned to baseline and stable Postop Assessment: no apparent nausea or vomiting Anesthetic complications: no    Last Vitals:  Vitals:   12/06/18 1245 12/06/18 1307  BP: (!) 104/43 (!) 105/49  Pulse: 60 (!) 50  Resp: 13 18  Temp: 36.5 C   SpO2: 96% 100%    Last Pain:  Vitals:   12/06/18 1345  PainSc: Asleep                 Ryan P Ellender

## 2018-12-06 NOTE — Op Note (Signed)
NAME: LYRICA, MCCLARTY MEDICAL RECORD NT:[id number] ACCOUNT [id number] DATE OF BIRTH:03/11/1928 FACILITY: [facility] LOCATION: [facility] PHYSICIAN:Aleta Manternach Randel Pigg, MD  OPERATIVE REPORT  DATE OF PROCEDURE:  12/06/2018  PREOPERATIVE DIAGNOSIS:  Left proximal humerus fracture.  POSTOPERATIVE DIAGNOSIS:  Left proximal humerus fracture.  PROCEDURE:  Left reverse shoulder replacement using Bi[employer] comprehensive reverse glenosphere comprehensive reverse components including mini baseplate 25 mm with 1 central and 4 peripheral locking screws with 36+3 offset glenosphere and mini humeral stem  size 12 with mini humeral tray +6 taper offset 40 mm in diameter with +3 thickness 36 mm diameter highly cross-linked polyethylene bearing.  SURGEON:  Meredith Pel, MD  ASSISTANT:  Laure Kidney, RNFA and ____.  INDICATIONS:  This is an 85 year old patient with severe proximal left humerus fracture who presents for operative management after explanation of risks and benefits.  PROCEDURE IN DETAIL:  The patient was brought to the operating room where general anesthetic was induced.  Preoperative antibiotics administered.  Timeout was called.  The patient was placed in the beach-chair position with the head in neutral position.   Left arm, hand and shoulder were then pre-scrubbed with benzoyl peroxide, then prepped, prescrubbed with alcohol and Betadine, which was allowed to air dry, then prepped with DuraPrep solution and draped in a sterile manner.  Charlie Pitter was used to cover the  entire operative field.  Timeout was called.  Deltopectoral approach was made.  Skin and subcutaneous tissue were sharply divided.  The patient had a cephalic vein with branches which were coagulated.  Cephalic vein was tied off due to branches which  were bleeding near the main branch.  At this time, the plane was developed in the subdeltoid space, and some of the deltoid was partially elevated manually.  At this time, the patient's blood  pressure was relatively high and tranexamic acid was  initiated.  This did help with bleeding.  The biceps tendon and bicipital groove were identified.  Biceps tendon was tenotomized and the rotator interval was opened.  Osteotomy was performed on both the lesser and greater tuberosities.  The head was then  removed.  Stay sutures were placed in the greater and lesser tuberosity.  The capsule was released off the humeral neck.  Axillary nerve was palpated and protected at all times during the case at this time.  Next, the glenoid was prepared.  A guide pin  was placed in accordance with preoperative templating at about 5-10 degrees of inferior tilt, centered on the mid to lower portion of the glenoid.  Bone quality was marginal at best.  The glenosphere was well seated centrally within the glenoid.   Inferior predominantly over superior reaming in order to achieve an inferior tilt was performed.  The glenoid baseplate was placed with good purchase obtained on the central screw, and 4 peripheral locking screws were placed.  A +3 glenosphere was then  placed.  At this time, irrigation with IrriSept solution was performed.  Attention was then directed towards the proximal humerus.  There was a crack in the proximal humerus which extended about 2 cm down.  This was treated with a cable placed carefully  around the humeral shaft to avoid injury to any surrounding neurovascular structures.  The cable was not tightened, and that was tightened at the time of the implantation of the implant of the humeral stem.  Reaming and broaching were performed.  A size  12 mini stem had good press fit.  This was trialed with multiple combinations in  order to achieve maximum stability without being overly tight.  This was achieved and the patient had good range of motion with no instability with extension and adduction  and superior force.  The patient also had good range of motion, forward flexion and abduction, both above 90  degrees with good stability.  Trial components were removed.  Thorough irrigation was performed.  Proximal bone grafting was performed around the  humerus with native bone graft.  The stem was placed and the cable was tightened.  This gave very good fit of the prosthesis.  At this time, the tray and polyethylene bearing were placed with same stability parameters maintained.  Thorough irrigation  again performed.  Stay sutures were placed around both the greater and lesser tuberosity fragments as well as around the stem.  Two sutures placed in around-the-world fashion, 2 other sutures used to affix the greater tuberosity to the implant.  At this  time, thorough irrigation was performed within the joint, and that was done with both regular irrigating solution as well as the IrriSept solution.  Vancomycin powder was then placed both within the joint as well as around the incision.  The sutures were  then tied with good fixation and placement of both the subscap and the lesser tuberosity and greater tuberosity back to the shaft.  Next, the deltopectoral groove was closed using #1 Vicryl suture followed by interrupted inverted 0 Vicryl suture, 2-0  Vicryl suture and a 3-0 Monocryl.  Aquacel dressing was placed.  The patient was then placed in a shoulder immobilizer.  The patient tolerated the procedure well without immediate complications.  He was transferred to the recovery room in stable  condition.  LN/NUANCE  D:12/06/2018 T:12/06/2018 JOB:[id number]

## 2018-12-06 NOTE — Progress Notes (Signed)
Patient stable and pain is relatively well controlled Left hand perfused and mobile dressing dry Plan occupational therapy and physical therapy tomorrow with possible discharge tomorrow afternoon.  Hemoglobin to be checked at 5:00 today.  She started out at 10 and possibly may need transfusion if her hemoglobin is below 7.

## 2018-12-06 NOTE — Progress Notes (Signed)
Received call from [place] about patient pain medications. New orders received.

## 2018-12-06 NOTE — Brief Op Note (Signed)
   12/06/2018  11:01 AM  PATIENT:  [name]  85 y.o. female  PRE-OPERATIVE DIAGNOSIS:  left proximal humerus fracture  POST-OPERATIVE DIAGNOSIS:  left proximal humerus fracture  PROCEDURE:  Procedure(s): LEFT REVERSE SHOULDER ARTHROPLASTY  SURGEON:  Surgeon(s): Marlou Sa, Tonna Corner, MD  ASSISTANT: Modena Slater rnfa  Magnant pa  ANESTHESIA:   general  EBL: 400 ml    Total I/O In: 1000 [I.V.:1000] Out: 600 [Blood:600]  BLOOD ADMINISTERED: none  DRAINS: none   LOCAL MEDICATIONS USED: Marcaine morphine clonidine  SPECIMEN:  No Specimen  COUNTS:  YES  TOURNIQUET:  * No tourniquets in log *  DICTATION: .Other Dictation: Dictation Number [id number]  PLAN OF CARE: Admit to inpatient   PATIENT DISPOSITION:  PACU - hemodynamically stable

## 2018-12-06 NOTE — Anesthesia Procedure Notes (Signed)
Procedure Name: Intubation Performed by: Jermany Sundell H, CRNA Pre-anesthesia Checklist: Patient identified, Emergency Drugs available, Suction available and Patient being monitored Patient Re-evaluated:Patient Re-evaluated prior to induction Oxygen Delivery Method: Circle System Utilized Preoxygenation: Pre-oxygenation with 100% oxygen Induction Type: IV induction Ventilation: Mask ventilation without difficulty Laryngoscope Size: Mac and 3 Grade View: Grade I Tube type: Oral Tube size: 7.0 mm Number of attempts: 1 Airway Equipment and Method: Stylet Placement Confirmation: ETT inserted through vocal cords under direct vision,  positive ETCO2 and breath sounds checked- equal and bilateral Secured at: 23 cm Tube secured with: Tape Dental Injury: Teeth and Oropharynx as per pre-operative assessment        

## 2018-12-06 NOTE — Progress Notes (Signed)
Patient unable to produce urine at PAT appointment.  Urine collected DOS.  Dr. Marlou Sa aware and stated that we can move forward without results.

## 2018-12-07 LAB — CBC
HCT: 25.1 % — ABNORMAL LOW (ref 36.0–46.0)
Hemoglobin: 7.7 g/dL — ABNORMAL LOW (ref 12.0–15.0)
MCH: 29.1 pg (ref 26.0–34.0)
MCHC: 30.7 g/dL (ref 30.0–36.0)
MCV: 94.7 fL (ref 80.0–100.0)
Platelets: 232 10*3/uL (ref 150–400)
RBC: 2.65 MIL/uL — ABNORMAL LOW (ref 3.87–5.11)
RDW: 14.1 % (ref 11.5–15.5)
WBC: 12.9 10*3/uL — ABNORMAL HIGH (ref 4.0–10.5)
nRBC: 0 % (ref 0.0–0.2)

## 2018-12-07 LAB — PREPARE RBC (CROSSMATCH)

## 2018-12-07 LAB — ABO/RH: ABO/RH(D): A POS

## 2018-12-07 MED ORDER — HYDROCODONE-ACETAMINOPHEN 5-325 MG PO TABS: 1.0000 | ORAL_TABLET | Freq: Four times a day (QID) | ORAL | 0 refills | Status: DC | PRN

## 2018-12-07 MED ORDER — METHOCARBAMOL 500 MG PO TABS: 500.0000 mg | ORAL_TABLET | Freq: Three times a day (TID) | ORAL | 0 refills | Status: DC | PRN

## 2018-12-07 MED ORDER — SODIUM CHLORIDE 0.9% IV SOLUTION: Freq: Once | INTRAVENOUS | Status: DC

## 2018-12-07 NOTE — Plan of Care (Signed)

## 2018-12-07 NOTE — Progress Notes (Signed)
  Subjective: Pt is a 85 y.o. female s/p L TSA.  She is POD1.  Pt notes that her pain is well-controlled while she is resting.  She has been moving her fingers to help with swelling. She feels more fatigued this morning than her baseline. Denies any lightheadedness or dizziness.    Objective: Vital signs in last 24 hours: Temp:  [97.6 F (36.4 C)-98.5 F (36.9 C)] 97.6 F (36.4 C) (06/26 0751) Pulse Rate:  [50-76] 76 (06/26 0751) Resp:  [12-21] 16 (06/26 0751) BP: (102-147)/(39-62) 147/57 (06/26 0751) SpO2:  [96 %-100 %] 98 % (06/26 0751)  Intake/Output from previous day: 06/25 0701 - 06/26 0700 In: 1733.2 [I.V.:1733.2] Out: 700 [Urine:100; Blood:600] Intake/Output this shift: No intake/output data recorded.  Exam:  Neurologically intact Neurovascular intact Intact pulses distally Axillary nerve sensation intact  Wrist extension intact  Labs: Recent Labs    12/04/18 1008 12/06/18 1214 12/06/18 1653 12/07/18 0624  HGB 10.0* 9.5* 9.3* 7.7*   Recent Labs    12/06/18 1214 12/06/18 1653 12/07/18 0624  WBC 16.7*  --  12.9*  RBC 3.28*  --  2.65*  HCT 29.9* 30.2* 25.1*  PLT 244  --  232   Recent Labs    12/04/18 1008  NA 138  K 4.4  CL 105  CO2 24  BUN 18  CREATININE 0.94  GLUCOSE 97  CALCIUM 9.7   No results for input(s): LABPT, INR in the last 72 hours.  Assessment/Plan: -Transfuse 1 unit of RBC due to symptomatic low Hgb  -once stable, pt discharged tomorrow -F/u with Dr. Marlou Sa in clinic   [place] 12/07/2018, 8:57 AM

## 2018-12-07 NOTE — Evaluation (Addendum)
Occupational Therapy Evaluation Patient Details Name: [name] MRN: [id number] DOB: 1927-07-07 Today's Date: 12/07/2018    History of Present Illness 85 year old female with left shoulder pain.  She had an injury about 2 weeks ago.  Has comminuted complex unfixable displaced proximal humerus fracture.  Now s/p Left reverse shoulder replacement. PMH includes, but not limited to, CAD, cancer, HTN, Chronic kidney disease, COPD, arthritis, Hypothyroidism.   Clinical Impression   Pt admitted with the above diagnoses and presents with below problem list. Pt will benefit from continued acute OT to address the below listed deficits and maximize independence with basic ADLs prior to d/c to venue below. PTA pt was mod I with ADLs. Pt currently max A with UB and LB ADLs, +2 for returning to bed. Fatigue and generalized weakness limiting session (noted that pt is now for transfusion). Attempted pivoting to [facility] but aborted attempt when pt had LOB attempting to stand requiring max-total A to prevent a fall. Optimistic that pt will mobilize better after transfusion and will be ok for d/c home in the next couple of days with her daughter staying with her 24/7. However if pt continues to have difficulty with functional transfers may need to consider ST SNF for further rehab.      Follow Up Recommendations  Home health OT;Supervision/Assistance - 24 hour    Equipment Recommendations  None recommended by OT    Recommendations for Other Services PT consult     Precautions / Restrictions Precautions Precautions: Shoulder;Fall Shoulder Interventions: Shoulder sling/immobilizer;At all times;Off for dressing/bathing/exercises;Shoulder abduction pillow Precaution Booklet Issued: Yes (comment) Required Braces or Orthoses: Sling Restrictions Weight Bearing Restrictions: Yes LUE Weight Bearing: Non weight bearing      Mobility Bed Mobility Overal bed mobility: Needs Assistance Bed Mobility: Supine to  Sit;Sit to Supine     Supine to sit: Max assist;HOB elevated;Mod assist Sit to supine: Mod assist;+2 for physical assistance   General bed mobility comments: assist to pivot hips and power up trunk to EOB position. +2 to control descent and advance BLE to return to supine  Transfers Overall transfer level: Needs assistance               General transfer comment: attempted pivot transfer to [facility] unsuccessfully. Pt with LOB attempting to stand requiring max-total A to prevent a fall. Aborted attempt and assisted pt back to supine.     Balance Overall balance assessment: Needs assistance;History of Falls Sitting-balance support: Single extremity supported;Feet supported Sitting balance-Leahy Scale: Fair                                     ADL either performed or assessed with clinical judgement   ADL Overall ADL's : Needs assistance/impaired Eating/Feeding: Set up;Sitting   Grooming: Moderate assistance;Sitting   Upper Body Bathing: Maximal assistance;Sitting   Lower Body Bathing: Maximal assistance;Sitting/lateral leans   Upper Body Dressing : Maximal assistance;Sitting   Lower Body Dressing: Maximal assistance;Sitting/lateral leans                 General ADL Comments: Pt completed bed mobility and sat EOB a few minutes. Attempted pivoting to [facility]. Pt with LOB requiring max A to prevent a fall. Returned to supine with +2 mod A.      Vision         Perception     Praxis      Pertinent Vitals/Pain Pain Assessment:  Faces Faces Pain Scale: Hurts even more Pain Location: LUE Pain Descriptors / Indicators: Grimacing;Sore;Aching;Guarding Pain Intervention(s): Limited activity within patient's tolerance;Monitored during session;Repositioned(declined ice "not good for my arthritis")     Hand Dominance     Extremity/Trunk Assessment Upper Extremity Assessment Upper Extremity Assessment: LUE deficits/detail LUE Deficits / Details: s/p shoulder  surgery LUE: Unable to fully assess due to pain;Unable to fully assess due to immobilization LUE Sensation: WNL   Lower Extremity Assessment Lower Extremity Assessment: Defer to PT evaluation   Cervical / Trunk Assessment Cervical / Trunk Assessment: Kyphotic   Communication Communication Communication: No difficulties   Cognition Arousal/Alertness: Awake/alert(a little sleepy) Behavior During Therapy: WFL for tasks assessed/performed;Flat affect Overall Cognitive Status: Within Functional Limits for tasks assessed                                 General Comments: some slow processing perhaps due to/exacerbated by feeling poorly   General Comments       Exercises     Shoulder Instructions      Home Living Family/patient expects to be discharged to:: Private residence Living Arrangements: Alone Available Help at Discharge: Family;Available 24 hours/day Type of Home: House Home Access: Level entry     Home Layout: One level     Bathroom Shower/Tub: Walk-in shower         Home Equipment: Environmental consultant - 4 wheels;Wheelchair - manual;Shower seat   Additional Comments: Pt reports she fell during shower transfer at home.      Prior Functioning/Environment Level of Independence: Independent with assistive device(s)        Comments: rollator, gardens        OT Problem List: Decreased strength;Impaired balance (sitting and/or standing);Pain;Impaired UE functional use;Decreased knowledge of precautions;Decreased knowledge of use of DME or AE;Decreased activity tolerance      OT Treatment/Interventions: Self-care/ADL training;Therapeutic exercise;DME and/or AE instruction;Therapeutic activities;Patient/family education;Balance training    OT Goals(Current goals can be found in the care plan section) Acute Rehab OT Goals Patient Stated Goal: not stated OT Goal Formulation: With patient Time For Goal Achievement: 12/14/18 Potential to Achieve Goals:  Good ADL Goals Pt Will Perform Upper Body Bathing: with min assist;sitting Pt Will Perform Lower Body Bathing: with min assist;sit to/from stand Pt Will Perform Upper Body Dressing: with min assist;sitting Pt Will Perform Lower Body Dressing: with min assist;sit to/from stand Pt Will Transfer to Toilet: with min guard assist;ambulating Pt Will Perform Toileting - Clothing Manipulation and hygiene: with min assist;sit to/from stand Pt Will Perform Tub/Shower Transfer: Shower transfer;with min assist;ambulating;shower seat Additional ADL Goal #1: Pt will complete bed mobility at min guard level to prepare for OOB ADLs.  OT Frequency: Min 4X/week   Barriers to D/C:            Co-evaluation              AM-PAC OT "6 Clicks" Daily Activity     Outcome Measure Help from another person eating meals?: None Help from another person taking care of personal grooming?: A Lot Help from another person toileting, which includes using toliet, bedpan, or urinal?: A Lot Help from another person bathing (including washing, rinsing, drying)?: A Lot Help from another person to put on and taking off regular upper body clothing?: A Lot Help from another person to put on and taking off regular lower body clothing?: A Lot 6 Click Score: 14   End  of Session Equipment Utilized During Treatment: Other (comment)(sling) Nurse Communication: Other (comment);Mobility status  Activity Tolerance: Patient limited by fatigue Patient left: in bed;with call bell/phone within reach;with bed alarm set  OT Visit Diagnosis: Unsteadiness on feet (R26.81);Pain;History of falling (Z91.81);Muscle weakness (generalized) (M62.81) Pain - Right/Left: Left Pain - part of body: Shoulder;Arm                Time: 2641-5830 OT Time Calculation (min): 34 min Charges:  OT General Charges $OT Visit: 1 Visit OT Evaluation $OT Eval Low Complexity: 1 Low OT Treatments $Self Care/Home Management : 8-22 mins  Tyrone Schimke,  OT Acute Rehabilitation Services Pager: [phone] Office: [phone]   Hortencia Pilar 12/07/2018, 9:55 AM

## 2018-12-07 NOTE — Progress Notes (Addendum)
Received call from Dr. Marlou Sa for updates on patient. Stated that he "will keep her over the weekend" and "will possibly go to rehab after discharge" because he "doesn't think she is strong enough to go home". No new orders received.

## 2018-12-07 NOTE — Progress Notes (Signed)
Called PA Dwana Melena that was on call due to pt having a pale color. Noted no labs ordered for this AM. New orders received for CBC.

## 2018-12-07 NOTE — Evaluation (Signed)
Physical Therapy Evaluation Patient Details Name: [name] MRN: [id number] DOB: July 28, 1927 Today's Date: 12/07/2018   History of Present Illness  85 year old female with left shoulder pain.  She had an injury about 2 weeks ago.  Has comminuted complex unfixable displaced proximal humerus fracture.  Now s/p Left reverse shoulder replacement. PMH includes, but not limited to, CAD, cancer, HTN, Chronic kidney disease, COPD, arthritis, Hypothyroidism.  Clinical Impression  Pt s/p surgery above with deficits below. Pt with improved tolerance in afternoon after receiving blood transfusion following OT session. Pt was able to transfer to chair, however, continues to require mod A +2 for assistance. Pt reports her son and daughter have been helping with mobility tasks. Educated about need to use WC at d/c if pt to d/c home; pt voiced understanding and reports she has WC at home. Will need to ensure family can provide necessary level of assist if pt continues to require increased assist prior to d/c home. If family unable to provide assist, pt will require SNF level therapies prior to d/c home. Will continue to follow acutely to maximize functional mobility independence and safety.      Follow Up Recommendations Home health PT;Supervision/Assistance - 24 hour(vs SNF pending progression)    Equipment Recommendations  None recommended by PT    Recommendations for Other Services       Precautions / Restrictions Precautions Precautions: Shoulder;Fall Shoulder Interventions: Shoulder sling/immobilizer;At all times;Off for dressing/bathing/exercises;Shoulder abduction pillow Precaution Booklet Issued: Yes (comment) Required Braces or Orthoses: Sling Restrictions Weight Bearing Restrictions: Yes LUE Weight Bearing: Non weight bearing      Mobility  Bed Mobility Overal bed mobility: Needs Assistance Bed Mobility: Supine to Sit     Supine to sit: HOB elevated;Mod assist     General bed  mobility comments: Mod A for trunk elevation and assist to scoot hips to EOB.   Transfers Overall transfer level: Needs assistance Equipment used: None Transfers: Sit to/from Stand Sit to Stand: Mod assist;+2 physical assistance         General transfer comment: Mod A +2 to perform stand pivot to chair. Pt holding on to PT arm with RUE. Cues for upright, as pt attempting to prematurely sit.   Ambulation/Gait                Stairs            Wheelchair Mobility    Modified Rankin (Stroke Patients Only)       Balance Overall balance assessment: Needs assistance;History of Falls Sitting-balance support: Single extremity supported;Feet supported Sitting balance-Leahy Scale: Fair     Standing balance support: Single extremity supported Standing balance-Leahy Scale: Poor Standing balance comment: Reliant on RUE and external support                              Pertinent Vitals/Pain Pain Assessment: Faces Faces Pain Scale: Hurts even more Pain Location: LUE Pain Descriptors / Indicators: Grimacing;Sore;Aching;Guarding Pain Intervention(s): Limited activity within patient's tolerance;Monitored during session;Repositioned    Home Living Family/patient expects to be discharged to:: Private residence Living Arrangements: Alone Available Help at Discharge: Family;Available 24 hours/day Type of Home: House Home Access: Level entry     Home Layout: One level Home Equipment: Walker - 4 wheels;Wheelchair - manual;Shower seat Additional Comments: Pt reports she fell during shower transfer at home.    Prior Function Level of Independence: Needs assistance   Gait / Transfers Assistance Needed:  Reports she uses rollator for mobility inside of home. Reports daughter and son help with WC mobility outside of home.   ADL's / Homemaking Assistance Needed: Daughter assists with bathing and dressing.         Hand Dominance        Extremity/Trunk  Assessment   Upper Extremity Assessment Upper Extremity Assessment: Defer to OT evaluation    Lower Extremity Assessment Lower Extremity Assessment: Generalized weakness    Cervical / Trunk Assessment Cervical / Trunk Assessment: Kyphotic  Communication   Communication: No difficulties  Cognition Arousal/Alertness: Awake/alert(a little sleepy) Behavior During Therapy: WFL for tasks assessed/performed;Flat affect Overall Cognitive Status: No family/caregiver present to determine baseline cognitive functioning                                 General Comments: some slow processing noted.      General Comments General comments (skin integrity, edema, etc.): Educated about need for physical assist for transfers at home. Educated about using WC at home to improve safety.     Exercises     Assessment/Plan    PT Assessment Patient needs continued PT services  PT Problem List Decreased strength;Decreased balance;Decreased activity tolerance;Decreased mobility;Decreased range of motion;Decreased knowledge of use of DME;Decreased knowledge of precautions;Pain       PT Treatment Interventions Gait training;Stair training;Functional mobility training;Therapeutic activities;Therapeutic exercise;Balance training;Patient/family education;Wheelchair mobility training    PT Goals (Current goals can be found in the Care Plan section)  Acute Rehab PT Goals Patient Stated Goal: to be able to walk  PT Goal Formulation: With patient Time For Goal Achievement: 12/21/18 Potential to Achieve Goals: Good    Frequency Min 5X/week   Barriers to discharge        Co-evaluation               AM-PAC PT "6 Clicks" Mobility  Outcome Measure Help needed turning from your back to your side while in a flat bed without using bedrails?: A Lot Help needed moving from lying on your back to sitting on the side of a flat bed without using bedrails?: A Lot Help needed moving to and from  a bed to a chair (including a wheelchair)?: A Lot Help needed standing up from a chair using your arms (e.g., wheelchair or bedside chair)?: A Lot Help needed to walk in hospital room?: Total Help needed climbing 3-5 steps with a railing? : Total 6 Click Score: 10    End of Session Equipment Utilized During Treatment: Gait belt Activity Tolerance: Patient tolerated treatment well Patient left: in chair;with call bell/phone within reach;with nursing/sitter in room Nurse Communication: Mobility status PT Visit Diagnosis: Muscle weakness (generalized) (M62.81);Difficulty in walking, not elsewhere classified (R26.2);Unsteadiness on feet (R26.81);History of falling (Z91.81)    Time: 1725-1743 PT Time Calculation (min) (ACUTE ONLY): 18 min   Charges:   PT Evaluation $PT Eval Moderate Complexity: [place], PT, DPT  Acute Rehabilitation Services  Pager: [phone] Office: [phone]   [name] 12/07/2018, 6:22 PM

## 2018-12-08 ENCOUNTER — Encounter (HOSPITAL_COMMUNITY): Payer: Self-pay | Admitting: Orthopedic Surgery

## 2018-12-08 LAB — TYPE AND SCREEN
ABO/RH(D): A POS
Antibody Screen: NEGATIVE
Unit division: 0

## 2018-12-08 LAB — BPAM RBC
Blood Product Expiration Date: 202006292359
ISSUE DATE / TIME: 202006261207
Unit Type and Rh: 600

## 2018-12-08 NOTE — Progress Notes (Signed)
Physical Therapy Treatment Patient Details Name: [name] MRN: [id number] DOB: May 12, 1928 Today's Date: 12/08/2018    History of Present Illness 85 year old female with left shoulder pain.  She had an injury about 2 weeks ago.  Has comminuted complex unfixable displaced proximal humerus fracture.  Now s/p Left reverse shoulder replacement. PMH includes, but not limited to, CAD, cancer, HTN, Chronic kidney disease, COPD, arthritis, Hypothyroidism.    PT Comments    Pt supine in bed on arrival.  Pt agreeable to PT session.  Pt continues to require moderate assistance to achieve standing with lift equipment ( sara stedy) .  Session focused on standing tolerance.  Paged OT to room during session to address sling fit.  While sitting edge of bed post transfer training Pt with R lateral lean due to fatigue and increased WOB noted.  Based on function will update recommendations to SNF placement.  Daughter works part time and cannot provide 24 hr assistance.  Plan for treatment tomorrow to maximize services to restore function.     Follow Up Recommendations  SNF;Supervision/Assistance - 24 hour;Other (comment)(will require HHPT if she choose not to follow PT recommendations for SNF placement.)     Equipment Recommendations  None recommended by PT    Recommendations for Other Services       Precautions / Restrictions Precautions Precautions: Shoulder;Fall Shoulder Interventions: Shoulder sling/immobilizer;At all times;Off for dressing/bathing/exercises;Shoulder abduction pillow Precaution Booklet Issued: Yes (comment) Required Braces or Orthoses: Sling Restrictions Weight Bearing Restrictions: Yes LUE Weight Bearing: Non weight bearing    Mobility  Bed Mobility Overal bed mobility: Needs Assistance Bed Mobility: Supine to Sit     Supine to sit: HOB elevated;Mod assist Sit to supine: Max assist;+2 for physical assistance   General bed mobility comments: Pt able to initiate  movement to edge of bed with LE required moderate assistance to elevate trunk into sitting and scoot hips forward to edge of bed.  To return she required max +2 due to fatigue.  Transfers Overall transfer level: Needs assistance Equipment used: Ambulation equipment used(sara stedy) Transfers: Sit to/from Stand Sit to Stand: Mod assist(with use of stedy otherwise will require +2 to stand without due to buckling.)         General transfer comment: Pt performed multiple standing trials in sara stedy frame ranging from 45 sec to 90 sec.  Cues for upper trunk control and hip extension.  Pt noted with R knee buckling during second trial.  Pt fatigue quickly and returned to sitting edge of bed with R lateral lean.  Ambulation/Gait Ambulation/Gait assistance: (NT not safe at this time.)               Stairs             Wheelchair Mobility    Modified Rankin (Stroke Patients Only)       Balance Overall balance assessment: Needs assistance Sitting-balance support: Single extremity supported;Feet supported Sitting balance-Leahy Scale: Poor(R lateral lean noted in sitting when fatigued.)       Standing balance-Leahy Scale: Poor Standing balance comment: R UE support with external assistance                            Cognition Arousal/Alertness: Awake/alert Behavior During Therapy: WFL for tasks assessed/performed;Flat affect Overall Cognitive Status: No family/caregiver present to determine baseline cognitive functioning  General Comments: some slow processing noted but otherwise able to follow commands.   pt is not realistic in her ability to return home with adequate support.      Exercises      General Comments        Pertinent Vitals/Pain Pain Assessment: Faces Faces Pain Scale: Hurts little more Pain Location: LUE Pain Descriptors / Indicators: Grimacing;Sore;Aching;Guarding Pain Intervention(s):  Monitored during session;Repositioned    Home Living                      Prior Function            PT Goals (current goals can now be found in the care plan section) Acute Rehab PT Goals Patient Stated Goal: to be able to walk  Potential to Achieve Goals: Fair Progress towards PT goals: Progressing toward goals    Frequency    Min 5X/week      PT Plan Discharge plan needs to be updated    Co-evaluation PT/OT/SLP Co-Evaluation/Treatment: Yes Reason for Co-Treatment: Complexity of the patient's impairments (multi-system involvement);Necessary to address cognition/behavior during functional activity;To address functional/ADL transfers PT goals addressed during session: Mobility/safety with mobility OT goals addressed during session: ADL's and self-care      AM-PAC PT "6 Clicks" Mobility   Outcome Measure  Help needed turning from your back to your side while in a flat bed without using bedrails?: A Lot Help needed moving from lying on your back to sitting on the side of a flat bed without using bedrails?: A Lot Help needed moving to and from a bed to a chair (including a wheelchair)?: A Lot Help needed standing up from a chair using your arms (e.g., wheelchair or bedside chair)?: A Lot Help needed to walk in hospital room?: Total Help needed climbing 3-5 steps with a railing? : Total 6 Click Score: 10    End of Session Equipment Utilized During Treatment: Gait belt Activity Tolerance: Patient tolerated treatment well Patient left: with call bell/phone within reach;with nursing/sitter in room;in bed Nurse Communication: Mobility status PT Visit Diagnosis: Muscle weakness (generalized) (M62.81);Difficulty in walking, not elsewhere classified (R26.2);Unsteadiness on feet (R26.81);History of falling (Z91.81)     Time: 1525-1601 PT Time Calculation (min) (ACUTE ONLY): 36 min  Charges:  $Therapeutic Activity: 8-22 mins                     Governor Rooks,  PTA Acute Rehabilitation Services Pager [phone] Office [phone]     Jarquavious Fentress Eli Hose 12/08/2018, 4:18 PM

## 2018-12-08 NOTE — Progress Notes (Signed)
Occupational Therapy Treatment Patient Details Name: [name] MRN: [id number] DOB: 03/02/1928 Today's Date: 12/08/2018    History of present illness 85 year old female with left shoulder pain.  She had an injury about 2 weeks ago.  Has comminuted complex unfixable displaced proximal humerus fracture.  Now s/p Left reverse shoulder replacement. PMH includes, but not limited to, CAD, cancer, HTN, Chronic kidney disease, COPD, arthritis, Hypothyroidism.   OT comments  PT requested OT assist with sling fit.  Sling adjusted and pt instructed on placement of pads.  Pt requires min A to maintain EOB sitting and max A +2 for sit to supine.  Long discussion with pt re: her current status and needs for home.  She is insistent her daughter will be able to help her, however, after discussion, she is willing to consider SNF level rehab.  Recommendations updated.    Follow Up Recommendations  SNF;Supervision/Assistance - 24 hour    Equipment Recommendations  3 in 1 bedside commode;Wheelchair (measurements OT);Wheelchair cushion (measurements OT)    Recommendations for Other Services      Precautions / Restrictions Precautions Precautions: Shoulder;Fall Shoulder Interventions: Shoulder sling/immobilizer;At all times;Off for dressing/bathing/exercises;Shoulder abduction pillow Precaution Booklet Issued: Yes (comment) Required Braces or Orthoses: Sling Restrictions Weight Bearing Restrictions: Yes LUE Weight Bearing: Non weight bearing       Mobility Bed Mobility Overal bed mobility: Needs Assistance Bed Mobility: Sit to Supine     Supine to sit: HOB elevated;Mod assist Sit to supine: Max assist;+2 for physical assistance   General bed mobility comments: Pt unable to scoot hips back on bed.  She was able to extend knees, but unable to lift LEs onto bed  - assist provided for all aspects   Transfers Overall transfer level: Needs assistance Equipment used: Ambulation equipment  used(sara stedy) Transfers: Sit to/from Stand Sit to Stand: Mod assist(with use of stedy otherwise will require +2 to stand without due to buckling.)         General transfer comment: Pt performed multiple standing trials in sara stedy frame ranging from 45 sec to 90 sec.  Cues for upper trunk control and hip extension.  Pt noted with R knee buckling during second trial.  Pt fatigue quickly and returned to sitting edge of bed with R lateral lean.    Balance Overall balance assessment: Needs assistance Sitting-balance support: Single extremity supported;Feet supported Sitting balance-Leahy Scale: Poor Sitting balance - Comments: Pt demonstrates Lt lateral lean.  She does not spontaneously correct, and requires cues and, at times, min A  to do so      Standing balance-Leahy Scale: Poor Standing balance comment: R UE support with external assistance                           ADL either performed or assessed with clinical judgement   ADL                                               Vision       Perception     Praxis      Cognition Arousal/Alertness: Awake/alert Behavior During Therapy: [facility] for tasks assessed/performed;Flat affect Overall Cognitive Status: No family/caregiver present to determine baseline cognitive functioning  General Comments: Pt is slow to process info, and requires min - mod cues for problem solving         Exercises     Shoulder Instructions       General Comments assisted PT with postioning of sling.  Pt reports she lives with her daughter, who works ~ 2hours/day.   Long discussion with pt re: current deficits, and care needs.  She demonstrates decreased ability to generalize current deficits to how they may impact her at home.  After extensive discussion, she is agreeable to consider SNF level rehab     Pertinent Vitals/ Pain       Pain Assessment: Faces Faces Pain  Scale: Hurts little more Pain Location: LUE Pain Descriptors / Indicators: Grimacing;Sore;Aching;Guarding Pain Intervention(s): Monitored during session  Home Living                                          Prior Functioning/Environment              Frequency  Min 4X/week        Progress Toward Goals  OT Goals(current goals can now be found in the care plan section)  Progress towards OT goals: Progressing toward goals  Acute Rehab OT Goals Patient Stated Goal: to be able to walk   Plan Discharge plan needs to be updated    Co-evaluation    PT/OT/SLP Co-Evaluation/Treatment: Yes Reason for Co-Treatment: Complexity of the patient's impairments (multi-system involvement);Necessary to address cognition/behavior during functional activity;To address functional/ADL transfers;For patient/therapist safety PT goals addressed during session: Mobility/safety with mobility OT goals addressed during session: ADL's and self-care      AM-PAC OT "6 Clicks" Daily Activity     Outcome Measure   Help from another person eating meals?: None Help from another person taking care of personal grooming?: A Lot Help from another person toileting, which includes using toliet, bedpan, or urinal?: A Lot Help from another person bathing (including washing, rinsing, drying)?: A Lot Help from another person to put on and taking off regular upper body clothing?: A Lot Help from another person to put on and taking off regular lower body clothing?: A Lot 6 Click Score: 14    End of Session Equipment Utilized During Treatment: Other (comment)(Lt Abduction sling )  OT Visit Diagnosis: Unsteadiness on feet (R26.81);Pain;History of falling (Z91.81);Muscle weakness (generalized) (M62.81) Pain - Right/Left: Left Pain - part of body: Shoulder;Arm   Activity Tolerance Patient limited by fatigue   Patient Left in bed;with call bell/phone within reach;with bed alarm set   Nurse  Communication Mobility status        Time: 2330-0762 OT Time Calculation (min): 14 min  Charges: OT General Charges $OT Visit: 1 Visit OT Treatments $Self Care/Home Management : 8-22 mins  Lucille Passy, OTR/L [place] Pager [phone] Office [phone]    Lucille Passy M 12/08/2018, 5:17 PM

## 2018-12-08 NOTE — Progress Notes (Signed)
Occupational Therapy Treatment Patient Details Name: [name] MRN: [id number] DOB: January 27, 1928 Today's Date: 12/08/2018    History of present illness 85 year old female with left shoulder pain.  She had an injury about 2 weeks ago.  Has comminuted complex unfixable displaced proximal humerus fracture.  Now s/p Left reverse shoulder replacement. PMH includes, but not limited to, CAD, cancer, HTN, Chronic kidney disease, COPD, arthritis, Hypothyroidism.   OT comments  Pt. Seen for skilled OT session. Extensive training and education on proper sling positioning and placement.  Stand pivot to/from bsc. Pt. Mod a x2 for physical assistance and safe transfer techniques.  Reports dtr. Available to assist but need to determine if this level of physical assistance is available at home otherwise other d/c arrangements would need to be made.    Follow Up Recommendations  Home health OT;Supervision/Assistance - 24 hour    Equipment Recommendations  None recommended by OT    Recommendations for Other Services      Precautions / Restrictions Precautions Precautions: Shoulder;Fall Shoulder Interventions: Shoulder sling/immobilizer;At all times;Off for dressing/bathing/exercises;Shoulder abduction pillow Required Braces or Orthoses: Sling Restrictions Weight Bearing Restrictions: Yes LUE Weight Bearing: Non weight bearing       Mobility Bed Mobility               General bed mobility comments: seated in recliner at beginning and end of session  Transfers Overall transfer level: Needs assistance Equipment used: None Transfers: Sit to/from [facility] Sit to Stand: Mod assist;+2 physical assistance;+2 safety/equipment Stand pivot transfers: Mod assist;+2 physical assistance;+2 safety/equipment       General transfer comment: transfer to R to bsc, transfer to L back to recliner.  max cues for hand placement and to complete full pivot before trying to sit down.     Balance                                           ADL either performed or assessed with clinical judgement   ADL Overall ADL's : Needs assistance/impaired                 Upper Body Dressing : Total assistance;Sitting Upper Body Dressing Details (indicate cue type and reason): don brace and gown     Toilet Transfer: Moderate assistance;+2 for physical assistance;+2 for safety/equipment;BSC   Toileting- Clothing Manipulation and Hygiene: Moderate assistance;Sit to/from stand       Functional mobility during ADLs: Modified independent;+2 for physical assistance;+2 for safety/equipment General ADL Comments: fatigues easily and has notable SOB with any physical movements. reports dtr. is available to assist but pt. still requiring a lot of physical assistance for functional mobility and transfers     Vision       Perception     Praxis      Cognition Arousal/Alertness: Awake/alert Behavior During Therapy: [facility] for tasks assessed/performed;Flat affect Overall Cognitive Status: No family/caregiver present to determine baseline cognitive functioning                                          Exercises     Shoulder Instructions       General Comments      Pertinent Vitals/ Pain       Pain Assessment: No/denies pain  Home Living  Prior Functioning/Environment              Frequency  Min 4X/week        Progress Toward Goals  OT Goals(current goals can now be found in the care plan section)  Progress towards OT goals: Progressing toward goals     Plan Discharge plan remains appropriate    Co-evaluation                 AM-PAC OT "6 Clicks" Daily Activity     Outcome Measure   Help from another person eating meals?: None Help from another person taking care of personal grooming?: A Lot Help from another person toileting, which includes using  toliet, bedpan, or urinal?: A Lot Help from another person bathing (including washing, rinsing, drying)?: A Lot Help from another person to put on and taking off regular upper body clothing?: A Lot Help from another person to put on and taking off regular lower body clothing?: A Lot 6 Click Score: 14    End of Session Equipment Utilized During Treatment: Other (comment)(L shoulder sling)  OT Visit Diagnosis: Unsteadiness on feet (R26.81);Pain;History of falling (Z91.81);Muscle weakness (generalized) (M62.81) Pain - Right/Left: Left Pain - part of body: Shoulder;Arm   Activity Tolerance Patient limited by fatigue   Patient Left in chair;with call bell/phone within reach   Nurse Communication Mobility status;Other (comment)(spoke with CNA-pure wik removed with goal of pt. transfer to/from bsc in prep for home and increasing mobility)        Time: 8938-1017 OT Time Calculation (min): 46 min  Charges: OT General Charges $OT Visit: 1 Visit OT Treatments $Self Care/Home Management : 38-52 mins   Janice Coffin, COTA/L 12/08/2018, 10:30 AM

## 2018-12-08 NOTE — Plan of Care (Signed)
  Problem: Education: Goal: Knowledge of General Education information will improve Description: Including pain rating scale, medication(s)/side effects and non-pharmacologic comfort measures Outcome: Progressing   Problem: Clinical Measurements: Goal: Will remain free from infection Outcome: Progressing   

## 2018-12-08 NOTE — Progress Notes (Signed)
Subjective: 2 Days Post-Op Procedure(s) (LRB): LEFT REVERSE SHOULDER ARTHROPLASTY (Left) Patient reports pain as moderate.  Very slow mobility with therapy.  May need short-term SNF.  Objective: Vital signs in last 24 hours: Temp:  [97.7 F (36.5 C)-98.2 F (36.8 C)] 98.2 F (36.8 C) (06/27 0409) Pulse Rate:  [68-81] 78 (06/27 0409) Resp:  [15-18] 15 (06/27 0409) BP: (124-167)/(47-87) 141/47 (06/27 0409) SpO2:  [90 %-98 %] 98 % (06/27 0409)  Intake/Output from previous day: 06/26 0701 - 06/27 0700 In: 320 [P.O.:320] Out: 2450 [Urine:2450] Intake/Output this shift: No intake/output data recorded.  Recent Labs    12/06/18 1214 12/06/18 1653 12/07/18 0624  HGB 9.5* 9.3* 7.7*   Recent Labs    12/06/18 1214 12/06/18 1653 12/07/18 0624  WBC 16.7*  --  12.9*  RBC 3.28*  --  2.65*  HCT 29.9* 30.2* 25.1*  PLT 244  --  232   No results for input(s): NA, K, CL, CO2, BUN, CREATININE, GLUCOSE, CALCIUM in the last 72 hours. No results for input(s): LABPT, INR in the last 72 hours.  Sensation intact distally Intact pulses distally Incision: scant drainage Left hand swollen from dependent edema.  Assessment/Plan: 2 Days Post-Op Procedure(s) (LRB): LEFT REVERSE SHOULDER ARTHROPLASTY (Left) Up with therapy Discharge home with home health vs SNF early next week. Depending on progress with mobility.      Mcarthur Rossetti 12/08/2018, 8:16 AM

## 2018-12-08 NOTE — Plan of Care (Signed)
  Problem: Pain Managment: Goal: General experience of comfort will improve Outcome: Progressing   Problem: Safety: Goal: Ability to remain free from injury will improve Outcome: Progressing   Problem: Skin Integrity: Goal: Risk for impaired skin integrity will decrease Outcome: Progressing   

## 2018-12-09 NOTE — Progress Notes (Signed)
Pt discharge order cancelled due slow mobility, pain management, and low hemoglobin on yesterday. Will continue with therapy and nursing services. CM notified of need for SNF placement.

## 2018-12-09 NOTE — TOC Progression Note (Signed)
Transition of Care [facility]) - Progression Note    Patient Details  Name: [name] MRN: [id number] Date of Birth: 02/23/1928  Transition of [facility]) CM/SW Contact  Claudie Leach, RN Phone Number: 12/09/2018, 9:32 AM  Clinical Narrative:    Patient has d/c order but was unable to leave due to need for transfusion.  Therapy recommending SNF due to no 24 hour supervision at home.  Patient lives with adult daughter who is able to help but works and not able to be with patient all the time.  Patient prefers to go to SNF as recommended by therapies.  CSW, Lattie Haw, and RN are advised.  CSW will start SNF work up but due to insurance process patient will not be able to go to SNF until Monday or Tuesday.  Per CM handoff, [facility] is following patient for home health needs.  Katina with [facility] is advised of patient needs for [facility] after SNF.    Expected Discharge Plan: Skilled Nursing Facility Barriers to Discharge: Unsafe home situation, SNF Pending bed offer  Expected Discharge Plan and Services Expected Discharge Plan: [place]   Discharge Planning Services: CM Consult   Living arrangements for the past 2 months: Single Family Home Expected Discharge Date: 12/08/18                         HH Arranged: PT, OT [place] Agency: [facility] (now [facility]) Date [place]: 12/09/18 Time [place]: [phone] Representative spoke with at [place]: [place]) Interventions    Readmission Risk Interventions No flowsheet data found.

## 2018-12-09 NOTE — Progress Notes (Signed)
Physical Therapy Treatment Patient Details Name: [name] MRN: [id number] DOB: February 21, 1928 Today's Date: 12/09/2018    History of Present Illness 85 year old female with left shoulder pain.  She had an injury about 2 weeks ago.  Has comminuted complex unfixable displaced proximal humerus fracture.  Now s/p Left reverse shoulder replacement. PMH includes, but not limited to, CAD, cancer, HTN, Chronic kidney disease, COPD, arthritis, Hypothyroidism.    PT Comments    Patient seen for mobility progression. Pt is making gradual progress toward PT goals. Pt requires mod A-mod A +2 for mobility this session.  Continue to progress as tolerated with anticipated d/c to SNF for further skilled PT services.      Follow Up Recommendations  SNF;Supervision/Assistance - 24 hour (pt agreeable)     Equipment Recommendations  None recommended by PT    Recommendations for Other Services       Precautions / Restrictions Precautions Precautions: Shoulder;Fall Shoulder Interventions: Shoulder sling/immobilizer;At all times;Off for dressing/bathing/exercises;Shoulder abduction pillow Required Braces or Orthoses: Sling Restrictions Weight Bearing Restrictions: Yes LUE Weight Bearing: Non weight bearing    Mobility  Bed Mobility Overal bed mobility: Needs Assistance Bed Mobility: Supine to Sit     Supine to sit: Mod assist     General bed mobility comments: mod a to bring trunk upright but able to bring b les off of bed and scoot towards eob  Transfers Overall transfer level: Needs assistance Equipment used: 2 person hand held assist Transfers: Sit to/from [facility] Sit to Stand: Mod assist;+2 physical assistance;+2 safety/equipment Stand pivot transfers: Mod assist;+2 physical assistance;+2 safety/equipment       General transfer comment: assist to power up into standing and for balance/weight shifting for pivot EOB to [facility]; cues for sequencing and safe hand  placement   Ambulation/Gait Ambulation/Gait assistance: Mod assist;+2 physical assistance;+2 safety/equipment Gait Distance (Feet): 3 Feet Assistive device: (1 person HHA and 1 person assist at trunk with gait belt) Gait Pattern/deviations: Step-to pattern;Decreased step length - right;Decreased step length - left;Trunk flexed     General Gait Details: pt with flexed posture bilat LE weakness; assistand required for balance    Stairs             Wheelchair Mobility    Modified Rankin (Stroke Patients Only)       Balance Overall balance assessment: Needs assistance Sitting-balance support: Single extremity supported;Feet supported Sitting balance-Leahy Scale: Fair     Standing balance support: Single extremity supported;During functional activity Standing balance-Leahy Scale: Poor                              Cognition Arousal/Alertness: Awake/alert Behavior During Therapy: WFL for tasks assessed/performed;Flat affect Overall Cognitive Status: No family/caregiver present to determine baseline cognitive functioning                                 General Comments: decreased awareness of safety and deficits      Exercises      General Comments        Pertinent Vitals/Pain Pain Assessment: No/denies pain Faces Pain Scale: Hurts little more Pain Location: LUE Pain Descriptors / Indicators: Sore;Guarding Pain Intervention(s): Limited activity within patient's tolerance;Monitored during session;Repositioned    Home Living                      Prior Function  PT Goals (current goals can now be found in the care plan section) Progress towards PT goals: Progressing toward goals    Frequency    Min 5X/week      PT Plan Current plan remains appropriate    Co-evaluation PT/OT/SLP Co-Evaluation/Treatment: Yes Reason for Co-Treatment: For patient/therapist safety;To address functional/ADL transfers PT  goals addressed during session: Mobility/safety with mobility;Balance OT goals addressed during session: ADL's and self-care;Proper use of Adaptive equipment and DME      AM-PAC PT "6 Clicks" Mobility   Outcome Measure  Help needed turning from your back to your side while in a flat bed without using bedrails?: A Lot Help needed moving from lying on your back to sitting on the side of a flat bed without using bedrails?: A Lot Help needed moving to and from a bed to a chair (including a wheelchair)?: A Lot Help needed standing up from a chair using your arms (e.g., wheelchair or bedside chair)?: A Lot Help needed to walk in hospital room?: A Lot Help needed climbing 3-5 steps with a railing? : Total 6 Click Score: 11    End of Session Equipment Utilized During Treatment: Gait belt;Other (comment)(L UE shoulder sling/immobilizer) Activity Tolerance: Patient tolerated treatment well Patient left: in chair;with call bell/phone within reach Nurse Communication: Mobility status PT Visit Diagnosis: Muscle weakness (generalized) (M62.81);Difficulty in walking, not elsewhere classified (R26.2);Unsteadiness on feet (R26.81);History of falling (Z91.81)     Time: 0626-9485 PT Time Calculation (min) (ACUTE ONLY): 20 min  Charges:                        Earney Navy, PTA Acute Rehabilitation Services Pager: [phone] Office: [phone]     Darliss Cheney 12/09/2018, 10:32 AM

## 2018-12-09 NOTE — Progress Notes (Signed)
Spoke with pt's daughter and informed her of PT recommendation for pt to go to SNF. Pt's daughter states that she does not work and helps pt at home and that she would be able to assist the pt. Therapy chair has been delivered to pt's home and has already been fitted for pt per pt's daughter. Pt's daughter stated that if there weren't any other medical reasons for pt to not come home she would prefer pt to come home. Will update SW. Explained to pt's daughter that PT requested to work with pt again tomorrow. Pt voiced understanding. Will continue to monitor pt.

## 2018-12-09 NOTE — TOC Progression Note (Signed)
Transition of Care [facility]) - Progression Note    Patient Details  Name: [name] MRN: [id number] Date of Birth: [birth year]  Transition of Care [facility]) CM/SW [place], LCSW Phone Number:[phone] 12/09/2018, 1:54 PM  Clinical Narrative:    CSW spoke with patient's daughter and she is unclear why patient can not come back home. Daughter requested that we hold off on SNF placement until she speaks with the nurse. CSW called RN and asked her to call patient's daughter.   RN informed CSW that daughter wants patient to return home if possible if she can care for her. RN stated that OT will follow up with patient and then it will be decided how to proceed.  Please hold off on SNF placement for now.    Expected Discharge Plan: Skilled Nursing Facility Barriers to Discharge: Unsafe home situation, SNF Pending bed offer  Expected Discharge Plan and Services Expected Discharge Plan: [place]   Discharge Planning Services: CM Consult   Living arrangements for the past 2 months: Single Family Home Expected Discharge Date: 12/08/18                         HH Arranged: PT, OT [place] Agency: [facility] (now [facility]) Date [place]: 12/09/18 Time [place]: [phone] Representative spoke with at [place]: [place] ([place]) Interventions    Readmission Risk Interventions No flowsheet data found.

## 2018-12-09 NOTE — NC FL2 (Signed)
[place] LEVEL OF CARE SCREENING TOOL     IDENTIFICATION  Patient Name: [name] Birthdate: [birth year] Sex: female Admission Date (Current Location): 12/06/2018  [facility] and [place] Number:  [employer] and Address:  [facility] [place]. [facility], [place] [address] [place], [place] [address]      Provider Number: [id number]  Attending Physician Name and Address:  Meredith Pel, MD  Relative Name and Phone Number:  [name] [phone]    Current Level of Care: Hospital Recommended Level of Care: [place] Prior Approval Number:    Date Approved/Denied:   PASRR Number: [place] Must site is down  Discharge Plan: SNF    Current Diagnoses: Patient Active Problem List   Diagnosis Date Noted  . Proximal humerus fracture 12/06/2018  . ALLERGIC RHINITIS 10/16/2007  . URI 06/20/2007  . TMJ PAIN 06/20/2007  . UTI 06/20/2007  . ACUTE CYSTITIS 06/18/2007  . CHEST WALL PAIN, ACUTE 05/11/2007  . INSOMNIA UNSPECIFIED 04/27/2007  . HYPOTHYROIDISM 01/05/2007  . HYPERLIPIDEMIA 01/05/2007  . GOUT 01/05/2007  . ANXIETY 01/05/2007  . DEPRESSION 01/05/2007  . HYPERTENSION 01/05/2007  . CORONARY ARTERY DISEASE 01/05/2007  . COPD 01/05/2007  . ESOPHAGEAL STRICTURE 01/05/2007  . SPINAL STENOSIS 01/05/2007  . VASOVAGAL SYNCOPE 01/05/2007  . PERIPHERAL EDEMA 01/05/2007  . BREAST CANCER, HX OF 01/05/2007  . Personal history of other diseases of digestive system 01/05/2007    Orientation RESPIRATION BLADDER Height & Weight     Self, Time, Situation, Place  O2([place] 2L) Incontinent Weight: 200 lb 8 oz (90.9 kg) Height:  5\' 1"  (154.9 cm)  BEHAVIORAL SYMPTOMS/MOOD NEUROLOGICAL BOWEL NUTRITION STATUS      Continent Diet(regular)  AMBULATORY STATUS COMMUNICATION OF NEEDS Skin   Extensive Assist Verbally Surgical wounds(closed left shoulder, hydrocolloid dressing)                       Personal Care Assistance Level of  Assistance  Bathing, Feeding, Dressing Bathing Assistance: Maximum assistance Feeding assistance: Limited assistance Dressing Assistance: Maximum assistance Total Care Assistance: Limited assistance   Functional Limitations Info  Sight, Hearing, Speech Sight Info: Adequate Hearing Info: Impaired(hard of hearing) Speech Info: Adequate    SPECIAL CARE FACTORS FREQUENCY  PT (By licensed PT), OT (By licensed OT)     PT Frequency: 5x/wk OT Frequency: 5x/wk            Contractures Contractures Info: Not present    Additional Factors Info  Code Status, Allergies, Psychotropic Code Status Info: Full Allergies Info: Latex, Lidocaine, Procaine Hcl, Sulfonamide Derivatives Psychotropic Info: Zyprexa 10mg  daily at bed         Current Medications (12/09/2018):  This is the current hospital active medication list Current Facility-Administered Medications  Medication Dose Route Frequency Provider Last Rate Last Dose  . 0.9 %  sodium chloride infusion (Manually program via Guardrails IV Fluids)   Intravenous Once Meredith Pel, MD      . acebutolol (SECTRAL) capsule 200 mg  200 mg Oral Daily Meredith Pel, MD   200 mg at 12/09/18 1002  . acetaminophen (TYLENOL) tablet 325-650 mg  325-650 mg Oral Q6H PRN Meredith Pel, MD   650 mg at 12/07/18 1312  . albuterol (PROVENTIL) (2.5 MG/3ML) 0.083% nebulizer solution 2.5 mg  2.5 mg Nebulization Q6H PRN Meredith Pel, MD      . amLODipine (NORVASC) tablet 2.5 mg  2.5 mg Oral Daily  Meredith Pel, MD   2.5 mg at 12/09/18 1002  . aspirin chewable tablet 81 mg  81 mg Oral Daily Meredith Pel, MD   81 mg at 12/09/18 1003  . atorvastatin (LIPITOR) tablet 80 mg  80 mg Oral QHS Meredith Pel, MD   80 mg at 12/08/18 2027  . cholecalciferol (VITAMIN D3) tablet 1,000 Units  1,000 Units Oral Daily Meredith Pel, MD   1,000 Units at 12/09/18 1002  . diphenhydrAMINE (BENADRYL) tablet 25 mg  25 mg Oral Q6H PRN  Meredith Pel, MD      . docusate sodium (COLACE) capsule 100 mg  100 mg Oral BID Meredith Pel, MD   100 mg at 12/09/18 1002  . furosemide (LASIX) tablet 40 mg  40 mg Oral Daily Minda Ditto, RPH   40 mg at 12/09/18 1003   And  . furosemide (LASIX) tablet 20 mg  20 mg Oral QPC supper Minda Ditto, RPH   20 mg at 12/08/18 1656  . gabapentin (NEURONTIN) capsule 300 mg  300 mg Oral TID Meredith Pel, MD   300 mg at 12/09/18 1002  . HYDROcodone-acetaminophen (NORCO/VICODIN) 5-325 MG per tablet 1-2 tablet  1-2 tablet Oral Q4H PRN Aundra Dubin, PA-C   1 tablet at 12/09/18 1007  . levothyroxine (SYNTHROID) tablet 100 mcg  100 mcg Oral Q0600 Meredith Pel, MD   100 mcg at 12/09/18 [phone]  . menthol-cetylpyridinium (CEPACOL) lozenge 3 mg  1 lozenge Oral PRN Meredith Pel, MD       Or  . phenol (CHLORASEPTIC) mouth spray 1 spray  1 spray Mouth/Throat PRN Meredith Pel, MD      . methocarbamol (ROBAXIN) tablet 500 mg  500 mg Oral Q6H PRN Aundra Dubin, PA-C      . metoCLOPramide (REGLAN) tablet 5-10 mg  5-10 mg Oral Q8H PRN Meredith Pel, MD       Or  . metoCLOPramide (REGLAN) injection 5-10 mg  5-10 mg Intravenous Q8H PRN Meredith Pel, MD      . multivitamin with minerals tablet 1 tablet  1 tablet Oral Daily Marlou [place], MD   1 tablet at 12/09/18 1002  . OLANZapine (ZYPREXA) tablet 10 mg  10 mg Oral QHS Meredith Pel, MD   10 mg at 12/08/18 2027  . ondansetron (ZOFRAN) tablet 4 mg  4 mg Oral Q6H PRN Meredith Pel, MD       Or  . ondansetron [facility]) injection 4 mg  4 mg Intravenous Q6H PRN Meredith Pel, MD      . pantoprazole (PROTONIX) EC tablet 40 mg  40 mg Oral Daily Meredith Pel, MD   40 mg at 12/09/18 1002  . potassium chloride SA (K-DUR) CR tablet 20 mEq  20 mEq Oral q morning - 10a Meredith Pel, MD   20 mEq at 12/09/18 1003  . rOPINIRole (REQUIP) tablet 0.5-1 mg  0.5-1 mg Oral QHS Meredith Pel, MD   0.5 mg at 12/08/18 2026     Discharge Medications: Please see discharge summary for a list of discharge medications.  Relevant Imaging Results:  Relevant Lab Results:   Additional Information SS#: [id number]  Geralynn Ochs, LCSW

## 2018-12-09 NOTE — Plan of Care (Signed)
  Problem: Education: Goal: Knowledge of General Education information will improve Description: Including pain rating scale, medication(s)/side effects and non-pharmacologic comfort measures Outcome: Progressing   Problem: Health Behavior/Discharge Planning: Goal: Ability to manage health-related needs will improve Outcome: Not Progressing   

## 2018-12-09 NOTE — Progress Notes (Signed)
Occupational Therapy Treatment Patient Details Name: [name] MRN: [id number] DOB: 1927/12/28 Today's Date: 12/09/2018    History of present illness 85 year old female with left shoulder pain.  She had an injury about 2 weeks ago.  Has comminuted complex unfixable displaced proximal humerus fracture.  Now s/p Left reverse shoulder replacement. PMH includes, but not limited to, CAD, cancer, HTN, Chronic kidney disease, COPD, arthritis, Hypothyroidism.   OT comments  Pt. Seen with PT for skilled session.  Continued work on safety with functional mobility and transfers.  Pt. Able to complete bed mobility, stand pivot to bsc, and short distance ambulation to recliner. Still mod a x2 reporting more agreeable to short term SNF for continued therapy until one person assist where her daughter can resume her care.   Follow Up Recommendations  SNF;Supervision/Assistance - 24 hour    Equipment Recommendations  3 in 1 bedside commode;Wheelchair (measurements OT);Wheelchair cushion (measurements OT)    Recommendations for Other Services PT consult    Precautions / Restrictions Precautions Precautions: Shoulder;Fall Shoulder Interventions: Shoulder sling/immobilizer;At all times;Off for dressing/bathing/exercises;Shoulder abduction pillow Required Braces or Orthoses: Sling Restrictions Weight Bearing Restrictions: Yes LUE Weight Bearing: Non weight bearing       Mobility Bed Mobility Overal bed mobility: Needs Assistance Bed Mobility: Supine to Sit     Supine to sit: Mod assist     General bed mobility comments: mod a to bring trunk upright but able to bring b les off of bed and scoot towards eob  Transfers Overall transfer level: Needs assistance Equipment used: 2 person hand held assist Transfers: Sit to/from [facility] Sit to Stand: Mod assist;+2 physical assistance;+2 safety/equipment Stand pivot transfers: Mod assist;+2 physical assistance;+2  safety/equipment       General transfer comment: assist to power up into standing and for balance/weight shifting for pivot EOB to [facility]; cues for sequencing and safe hand placement     Balance Overall balance assessment: Needs assistance Sitting-balance support: Single extremity supported;Feet supported Sitting balance-Leahy Scale: Fair     Standing balance support: Single extremity supported;During functional activity Standing balance-Leahy Scale: Poor                             ADL either performed or assessed with clinical judgement   ADL Overall ADL's : Needs assistance/impaired                         Toilet Transfer: Moderate assistance;+2 for physical assistance;+2 for safety/equipment;BSC   Toileting- Clothing Manipulation and Hygiene: Moderate assistance;Sit to/from stand Toileting - Clothing Manipulation Details (indicate cue type and reason): pt. able to clean peri areas in standing with mod a x2 standing support     Functional mobility during ADLs: Modified independent;+2 for physical assistance;+2 for safety/equipment General ADL Comments: fatigues easily and has notable SOB with any physical movements.  d/c plans updated, pt. now agreeable to short term placement for cont. therapy prior to home     Vision       Perception     Praxis      Cognition Arousal/Alertness: Awake/alert Behavior During Therapy: [facility] for tasks assessed/performed;Flat affect Overall Cognitive Status: No family/caregiver present to determine baseline cognitive functioning                                 General Comments: decreased awareness of  safety and deficits        Exercises     Shoulder Instructions       General Comments      Pertinent Vitals/ Pain       Pain Assessment: No/denies pain Faces Pain Scale: Hurts little more Pain Location: LUE Pain Descriptors / Indicators: Sore;Guarding Pain Intervention(s): Limited activity within  patient's tolerance;Monitored during session;Repositioned  Home Living                                          Prior Functioning/Environment              Frequency  Min 4X/week        Progress Toward Goals  OT Goals(current goals can now be found in the care plan section)  Progress towards OT goals: Progressing toward goals     Plan Discharge plan remains appropriate    Co-evaluation    PT/OT/SLP Co-Evaluation/Treatment: Yes Reason for Co-Treatment: For patient/therapist safety;To address functional/ADL transfers PT goals addressed during session: Mobility/safety with mobility;Balance OT goals addressed during session: ADL's and self-care;Proper use of Adaptive equipment and DME      AM-PAC OT "6 Clicks" Daily Activity     Outcome Measure   Help from another person eating meals?: None Help from another person taking care of personal grooming?: A Lot Help from another person toileting, which includes using toliet, bedpan, or urinal?: A Lot Help from another person bathing (including washing, rinsing, drying)?: A Lot Help from another person to put on and taking off regular upper body clothing?: A Lot Help from another person to put on and taking off regular lower body clothing?: A Lot 6 Click Score: 14    End of Session Equipment Utilized During Treatment: Gait belt  OT Visit Diagnosis: Unsteadiness on feet (R26.81);Pain;History of falling (Z91.81);Muscle weakness (generalized) (M62.81) Pain - Right/Left: Left Pain - part of body: Shoulder;Arm   Activity Tolerance Patient tolerated treatment well   Patient Left in chair;with call bell/phone within reach   Nurse Communication          Time: 2353-6144 OT Time Calculation (min): 19 min  Charges: OT General Charges $OT Visit: 1 Visit OT Treatments $Self Care/Home Management : 8-22 mins   Janice Coffin, COTA/L 12/09/2018, 10:15 AM

## 2018-12-09 NOTE — Progress Notes (Signed)
Patient ID: [name], female   DOB: [birth year], 85 y.o.   MRN: [id number] No acute changes.  Less left hand swelling .  Left shoulder in a sling.  Bandage with only slight drainage. Therapy is recommending short-term skilled nursing.

## 2018-12-10 NOTE — Progress Notes (Signed)
Patient stable Left arm in sling She is still requiring assistance to get up and around Therapy has recommended short-term skilled nursing Patient would rather go home but it is unclear if she has enough support for that. Plan at this time is that medically the patient is ready for discharge.  Discharge summary is done.  FL 2 is signed.  Plan for discharge today to either home if that is acceptable with physical therapy versus skilled nursing if a bed is available Even though she desires to go home she still is requiring assistance to get up and around and is not entirely clear from my conversation with her whether or not 24/7 assistance will be there that can meet those needs.

## 2018-12-10 NOTE — Progress Notes (Signed)
Patient discharging home. Discharge instructions explained to patient's son and he verbalized understanding. Took all persona belongings. No further questions or concerns voiced.

## 2018-12-10 NOTE — Care Management Important Message (Signed)
Important Message  Patient Details  Name: [name] MRN: [id number] Date of Birth: 04-22-1928   Medicare Important Message Given:  Yes     Memory Argue 12/10/2018, 3:47 PM

## 2018-12-10 NOTE — Progress Notes (Signed)
Physician Discharge Summary      Patient ID: [name] MRN: [id number] DOB/AGE: 1928/04/05 85 y.o.  Admit date: 12/06/2018 Discharge date: 12/10/2018  Admission Diagnoses:  Active Problems:   Proximal humerus fracture   Discharge Diagnoses:  Same  Surgeries: Procedure(s): LEFT REVERSE SHOULDER ARTHROPLASTY on 12/06/2018   Consultants:   Discharged Condition: Stable  Hospital Course: [name] is an 85 y.o. female who was admitted 12/06/2018 with a chief complaint of left shoulder pain, and found to have a diagnosis of left proximal humerus fracture.  They were brought to the operating room on 12/06/2018 and underwent the above named procedures.  Patient tolerated the procedure well.  She was slow to mobilize with physical therapy.  She did receive 1 unit of packed red blood cell transfusion for preop hemoglobin of 10 which went down to 7.7.  She did well with physical therapy but did require assistance for mobilization within the room.  Since she does not have 24/7 assistance at home it is been recommended that she go to short-term skilled nursing.  We will arrange that.  Discharged in good condition on pain medicine and muscle relaxers.  She will follow-up with me in about 10 days.  Antibiotics given:  Anti-infectives (From admission, onward)   Start     Dose/Rate Route Frequency Ordered Stop   12/06/18 1400  ceFAZolin (ANCEF) IVPB 2g/100 mL premix     2 g 200 mL/hr over 30 Minutes Intravenous Every 6 hours 12/06/18 1305 12/06/18 2036   12/06/18 1030  vancomycin (VANCOCIN) powder  Status:  Discontinued       As needed 12/06/18 1028 12/06/18 1139   12/06/18 0630  ceFAZolin (ANCEF) IVPB 2g/100 mL premix     2 g 200 mL/hr over 30 Minutes Intravenous On call to O.R. 12/06/18 6063 12/06/18 0746    .  Recent vital signs:  Vitals:   12/09/18 1921 12/10/18 0456  BP: (!) 133/54 117/68  Pulse: 69 72  Resp: 16 18  Temp: 98.1 F (36.7 C) 98.4 F (36.9 C)  SpO2: 99% 98%      Recent laboratory studies:  Results for orders placed or performed during the hospital encounter of 12/06/18  Urinalysis, Routine w reflex microscopic  Result Value Ref Range   Color, Urine YELLOW YELLOW   APPearance CLEAR CLEAR   Specific Gravity, Urine 1.009 1.005 - 1.030   pH 5.0 5.0 - 8.0   Glucose, UA NEGATIVE NEGATIVE mg/dL   Hgb urine dipstick NEGATIVE NEGATIVE   Bilirubin Urine NEGATIVE NEGATIVE   Ketones, ur NEGATIVE NEGATIVE mg/dL   Protein, ur NEGATIVE NEGATIVE mg/dL   Nitrite POSITIVE (A) NEGATIVE   Leukocytes,Ua LARGE (A) NEGATIVE   RBC / HPF 0-5 0 - 5 RBC/hpf   WBC, UA >50 (H) 0 - 5 WBC/hpf   Bacteria, UA MANY (A) NONE SEEN   Squamous Epithelial / LPF 0-5 0 - 5   WBC Clumps PRESENT   CBC  Result Value Ref Range   WBC 16.7 (H) 4.0 - 10.5 K/uL   RBC 3.28 (L) 3.87 - 5.11 MIL/uL   Hemoglobin 9.5 (L) 12.0 - 15.0 g/dL   HCT 29.9 (L) 36.0 - 46.0 %   MCV 91.2 80.0 - 100.0 fL   MCH 29.0 26.0 - 34.0 pg   MCHC 31.8 30.0 - 36.0 g/dL   RDW 14.1 11.5 - 15.5 %   Platelets 244 150 - 400 K/uL   nRBC 0.0 0.0 - 0.2 %  Hemoglobin  and hematocrit, blood  Result Value Ref Range   Hemoglobin 9.3 (L) 12.0 - 15.0 g/dL   HCT 30.2 (L) 36.0 - 46.0 %  CBC  Result Value Ref Range   WBC 12.9 (H) 4.0 - 10.5 K/uL   RBC 2.65 (L) 3.87 - 5.11 MIL/uL   Hemoglobin 7.7 (L) 12.0 - 15.0 g/dL   HCT 25.1 (L) 36.0 - 46.0 %   MCV 94.7 80.0 - 100.0 fL   MCH 29.1 26.0 - 34.0 pg   MCHC 30.7 30.0 - 36.0 g/dL   RDW 14.1 11.5 - 15.5 %   Platelets 232 150 - 400 K/uL   nRBC 0.0 0.0 - 0.2 %  Prepare RBC  Result Value Ref Range   Order Confirmation      ORDER PROCESSED BY BLOOD BANK Performed at [facility], [place] [address], [place], [place] [address]   Type and screen [place]  Result Value Ref Range   ABO/RH(D) A POS    Antibody Screen NEG    Sample Expiration 12/10/2018,2359    Unit Number [id number]    Blood Component Type RED CELLS,LR    Unit division 00     Status of Unit ISSUED,FINAL    Transfusion Status OK TO TRANSFUSE    Crossmatch Result      Compatible Performed at [facility], [place] [address] [place], [place] [address]   ABO/Rh  Result Value Ref Range   ABO/RH(D)      A POS Performed at [place] [address], [place], [place] [address]   BPAM [facility]  Result Value Ref Range   ISSUE DATE / TIME [id number]    Blood Product Unit Number [id number]    PRODUCT CODE [id number]    Unit Type and Rh 0600    Blood Product Expiration Date [id number]     Discharge Medications:   Allergies as of 12/10/2018      Reactions   Latex Shortness Of Breath, Rash   Lidocaine    Procaine Hcl    Sulfonamide Derivatives       Medication List    STOP taking these medications   ibuprofen 200 MG tablet Commonly known as: ADVIL     TAKE these medications   acebutolol 200 MG capsule Commonly known as: SECTRAL Take 200 mg by mouth daily.   albuterol (2.5 MG/3ML) 0.083% nebulizer solution Commonly known as: PROVENTIL Take 2.5 mg by nebulization every 6 (six) hours as needed for wheezing or shortness of breath.   amLODipine 2.5 MG tablet Commonly known as: NORVASC Take 2.5 mg by mouth daily.   aspirin EC 81 MG tablet Take 81 mg by mouth daily.   atorvastatin 80 MG tablet Commonly known as: LIPITOR Take 80 mg by mouth at bedtime.   celecoxib 100 MG capsule Commonly known as: CELEBREX Take 100 mg by mouth 2 (two) times a day.   cholecalciferol 25 MCG (1000 UT) tablet Commonly known as: VITAMIN D Take 1,000 Units by mouth daily.   diphenhydrAMINE 25 MG tablet Commonly known as: BENADRYL Take 25 mg by mouth every 6 (six) hours as needed for allergies.   esomeprazole 20 MG capsule Commonly known as: NEXIUM Take 20 mg by mouth daily.   furosemide 20 MG tablet Commonly known as: LASIX Take 20-40 mg by mouth See admin instructions. Take 40 mg by mouth in the morning and 20 mg in the evening     HYDROcodone-acetaminophen 5-325 MG tablet Commonly  known as: NORCO/VICODIN Take 1 tablet by mouth every 6 (six) hours as needed for moderate pain or severe pain.   ICY HOT EX Apply 1 application topically daily as needed (pain).   Klor-Con M20 20 MEQ tablet Generic drug: potassium chloride SA Take 20 mEq by mouth every morning.   levothyroxine 100 MCG tablet Commonly known as: SYNTHROID Take 100 mcg by mouth daily before breakfast.   methocarbamol 500 MG tablet Commonly known as: ROBAXIN Take 1 tablet (500 mg total) by mouth every 8 (eight) hours as needed for muscle spasms.   multivitamin with minerals Tabs tablet Take 1 tablet by mouth daily.   OLANZapine 10 MG tablet Commonly known as: ZYPREXA Take 10 mg by mouth at bedtime.   rOPINIRole 0.5 MG tablet Commonly known as: REQUIP Take 0.5-1 mg by mouth at bedtime.       Diagnostic Studies: Ct Shoulder Left Wo Contrast  Result Date: 11/27/2018 CLINICAL DATA:  The patient suffered a proximal left humerus fracture in a fall 11/18/2018. Initial encounter. EXAM: CT OF THE UPPER LEFT EXTREMITY WITHOUT CONTRAST TECHNIQUE: Multidetector CT imaging of the upper left extremity was performed according to the standard protocol. COMPARISON:  Plain films left shoulder 11/18/2018. FINDINGS: Bones/Joint/Cartilage Again seen is a comminuted surgical neck fracture of the left humerus. There is approximately 1 shaft width anterior displacement of the humeral shaft. Fragment override of approximately 3 cm is identified. The patient's fracture involves the posterior aspect of the greater tuberosity which demonstrates mild anterior and medial displacement. The lesser tuberosity is spared. A small amount of callus formation is seen about the anterior aspect of the patient's fracture. Inferior subluxation of the humeral head is due to a joint effusion. The acromioclavicular joint is intact with mild degenerative change present. Type 1 acromion with some  subacromial spurring noted. Ligaments Suboptimally assessed by CT. Muscles and Tendons The rotator cuff appears intact. Retraction on the humeral head results in superior angulation of the greater tuberosity. Soft tissues Imaged lung parenchyma is clear. Atherosclerotic vascular disease is noted. IMPRESSION: Comminuted surgical neck fracture of the left humerus with impaction and anterior displacement of the shaft of the humerus. The greater tuberosity is medially rotated due to retraction by the rotator cuff. The fracture extends into the posterior aspect of the greater tuberosity. There is a small amount of callus formation about the fracture but no bridging bone is identified. Inferior subluxation of the humeral head is due to a joint glenohumeral effusion. Mild acromioclavicular osteoarthritis. Small subacromial spur noted. Electronically Signed   By: Inge Rise M.D.   On: 11/27/2018 14:18   Dg Shoulder Left Port  Result Date: 12/06/2018 CLINICAL DATA:  Left shoulder arthroplasty. EXAM: LEFT SHOULDER - 1 VIEW COMPARISON:  CT left shoulder dated November 27, 2018. FINDINGS: Interval left reverse total shoulder arthroplasty. Components are well aligned. No acute osseous abnormality. Expected postsurgical changes and subcutaneous emphysema around the left shoulder. Old left-sided rib fractures. IMPRESSION: 1. Interval left reverse total shoulder arthroplasty without acute postoperative complication. Electronically Signed   By: Titus Dubin M.D.   On: 12/06/2018 14:18    Disposition: Discharge disposition: 03-Skilled Nursing Facility       Discharge Instructions    Call MD / Call 911   Complete by: As directed    If you experience chest pain or shortness of breath, CALL 911 and be transported to the hospital emergency room.  If you develope a fever above 101 F, pus (white drainage) or increased  drainage or redness at the wound, or calf pain, call your surgeon's office.   Call MD / Call 911    Complete by: As directed    If you experience chest pain or shortness of breath, CALL 911 and be transported to the hospital emergency room.  If you develope a fever above 101 F, pus (white drainage) or increased drainage or redness at the wound, or calf pain, call your surgeon's office.   Constipation Prevention   Complete by: As directed    Drink plenty of fluids.  Prune juice may be helpful.  You may use a stool softener, such as Colace (over the counter) 100 mg twice a day.  Use MiraLax (over the counter) for constipation as needed.   Constipation Prevention   Complete by: As directed    Drink plenty of fluids.  Prune juice may be helpful.  You may use a stool softener, such as Colace (over the counter) 100 mg twice a day.  Use MiraLax (over the counter) for constipation as needed.   Diet - low sodium heart healthy   Complete by: As directed    Diet - low sodium heart healthy   Complete by: As directed    Discharge instructions   Complete by: As directed    No lifting with left arm Okay to shower dressing waterproof Use CPM machine 1 hour 3 times a day for passive motion only   Discharge instructions   Complete by: As directed    Okay for left shoulder passive range of motion and active assisted range of motion to pain tolerance Okay to use the left arm with a walker Follow-up in 10 days.  Okay to shower dressing is waterproof   Increase activity slowly as tolerated   Complete by: As directed    Increase activity slowly as tolerated   Complete by: As directed          Signed: Anderson Malta 12/10/2018, 7:21 AM

## 2018-12-10 NOTE — Plan of Care (Signed)
  Problem: Pain Managment: Goal: General experience of comfort will improve Outcome: Progressing   Problem: Safety: Goal: Ability to remain free from injury will improve Outcome: Progressing   Problem: Skin Integrity: Goal: Risk for impaired skin integrity will decrease Outcome: Progressing   

## 2018-12-10 NOTE — Progress Notes (Addendum)
Occupational Therapy Treatment Patient Details Name: [name] MRN: [id number] DOB: 1927-07-01 Today's Date: 12/10/2018    History of present illness 85 year old female with left shoulder pain.  She had an injury about 2 weeks ago.  Has comminuted complex unfixable displaced proximal humerus fracture.  Now s/p Left reverse shoulder replacement. PMH includes, but not limited to, CAD, cancer, HTN, Chronic kidney disease, COPD, arthritis, Hypothyroidism.   OT comments  This 85 yo female admitted and underwent above presents to acute OT making progress with overall mobility which will ease the burden of care. While at the same time not quite back to her baseline from mobility to standpoint based on conversation with dtr. Pt will still needs extensive A with basic ADLs.  Follow Up Recommendations  Home health OT;Supervision/Assistance - 24 hour;Other (comment)(dtr reports she can manage pt at home)          Precautions / Restrictions Precautions Precautions: Shoulder;Fall Shoulder Interventions: At all times;Off for dressing/bathing/exercises;Shoulder abduction pillow Precaution Booklet Issued: Yes (comment) Precaution Comments: Okay for left shoulder passive range of motion and active assisted range of motion to pain tolerance (order say 30 ER, 60 Abd, and 90 FF), pendulums OK, elbow-wrist-hand OK Required Braces or Orthoses: Sling Restrictions Weight Bearing Restrictions: Yes LUE Weight Bearing: Non weight bearing Other Position/Activity Restrictions: but can use RW with LUE per Dr. Randel Pigg discharge instructions       Mobility Bed Mobility Overal bed mobility: Needs Assistance Bed Mobility: Supine to Sit     Supine to sit: Min assist     General bed mobility comments: coming out on left hand side of bed with HOB up and using left upper bed rail--able to get legs off of bed and raise trunk but needed A to scoot forward  Transfers Overall transfer level: Needs  assistance Equipment used: Rolling walker (2 wheeled) Transfers: Sit to/from Stand Sit to Stand: Mod assist              Balance Overall balance assessment: Needs assistance Sitting-balance support: No upper extremity supported;Feet supported Sitting balance-Leahy Scale: Fair     Standing balance support: Bilateral upper extremity supported Standing balance-Leahy Scale: Poor Standing balance comment: Use or RW and additional external support                           ADL either performed or assessed with clinical judgement   ADL Overall ADL's : Needs assistance/impaired                         Toilet Transfer: Moderate assistance;Stand-pivot;BSC   Toileting- Clothing Manipulation and Hygiene: Total assistance Toileting - Clothing Manipulation Details (indicate cue type and reason): Mod A for balance       General ADL Comments: Spoke to dtr over phone to explain to her how pt is doing mobility wise, dtr feels she can handle her at home. Pt's son will be in at 14:00 today for family ed on gait belt use, transfers, ADLs, and LUE exercises.     Vision Patient Visual Report: No change from baseline            Cognition Arousal/Alertness: Awake/alert Behavior During Therapy: WFL for tasks assessed/performed Overall Cognitive Status: Within Functional Limits for tasks assessed  Pertinent Vitals/ Pain       Pain Assessment: 0-10 Pain Score: 5  Pain Location: LUE Pain Descriptors / Indicators: Sore Pain Intervention(s): Limited activity within patient's tolerance;Monitored during session;Repositioned         Frequency  Min 4X/week        Progress Toward Goals  OT Goals(current goals can now be found in the care plan section)  Progress towards OT goals: Progressing toward goals     Plan Discharge plan needs to be updated       AM-PAC OT "6 Clicks" Daily  Activity     Outcome Measure   Help from another person eating meals?: None Help from another person taking care of personal grooming?: A Lot Help from another person toileting, which includes using toliet, bedpan, or urinal?: A Lot Help from another person bathing (including washing, rinsing, drying)?: A Lot Help from another person to put on and taking off regular upper body clothing?: A Lot Help from another person to put on and taking off regular lower body clothing?: Total 6 Click Score: 13    End of Session Equipment Utilized During Treatment: Gait belt;Rolling walker(shoulder abduction sling)  OT Visit Diagnosis: Unsteadiness on feet (R26.81);Pain;History of falling (Z91.81);Muscle weakness (generalized) (M62.81) Pain - Right/Left: Left Pain - part of body: Shoulder   Activity Tolerance Patient tolerated treatment well   Patient Left in chair;with call bell/phone within reach;with chair alarm set   Nurse Communication NT: Mobility status--stand pivot with gait belt, +2 for toileting due to standing and needing A for hygiene. RN--(pt's son will be here at 14:00)        Time: 0758-0855 OT Time Calculation (min): 57 min  Charges: OT General Charges $OT Visit: 1 Visit OT Treatments $Self Care/Home Management : 53-67 mins  [name], OTR/L Acute [employer] Pager [phone] Office [phone]      [name] 12/10/2018, 9:19 AM

## 2018-12-10 NOTE — Progress Notes (Signed)
Occupational Therapy Treatment and Discharge Patient Details Name: [name] MRN: [id number] DOB: April 05, 1928 Today's Date: 12/10/2018    History of present illness 85 year old female with left shoulder pain.  She had an injury about 2 weeks ago.  Has comminuted complex unfixable displaced proximal humerus fracture.  Now s/p Left reverse shoulder replacement. PMH includes, but not limited to, CAD, cancer, HTN, Chronic kidney disease, COPD, arthritis, Hypothyroidism.   OT comments  This 85 yo female admitted with above presents to acute OT with son present this time for family ed mainly on exercises with some on ADLs. No further acute OT needs, we will sign off.  Follow Up Recommendations  Home health OT;Supervision/Assistance - 24 hour;Other (comment)          Precautions / Restrictions Precautions Precautions: Shoulder;Fall Shoulder Interventions: At all times;Off for dressing/bathing/exercises;Shoulder abduction pillow Precaution Comments: Okay for left shoulder passive range of motion and active assisted range of motion to pain tolerance (order say 30 ER, 60 Abd, and 90 FF), pendulums OK, elbow-wrist-hand OK Required Braces or Orthoses: Sling Restrictions Weight Bearing Restrictions: Yes LUE Weight Bearing: Non weight bearing Other Position/Activity Restrictions: but can use RW with LUE per Dr. Randel Pigg discharge instructions       Mobility Bed Mobility     General bed mobility comments: Pt up in recliner upon arrival  Transfers Overall transfer level: Needs assistance Equipment used: 1 person hand held assist Transfers: Sit to/from Stand Sit to Stand: Mod assist       General transfer comment: Educated son on use of gait belt    Balance Overall balance assessment: Needs assistance Sitting-balance support: No upper extremity supported;Feet supported Sitting balance-Leahy Scale: Fair     Standing balance support: Single extremity supported Standing balance-Leahy  Scale: Poor Standing balance comment: Single UE support and additional external support from therapist                           ADL either performed or assessed with clinical judgement   ADL Overall ADL's : Needs assistance/impaired                                       General ADL Comments: Family ed with son (son was already aware of how this complicated abduction sling works--he was able to put it back on her without problems. He helped her don her shirt (LUE first, then head, then RUE). I donned socks, depends, and pants for her. I helped her stand up with Mod A and he adjusted her LB clothing     Vision Patient Visual Report: No change from baseline            Cognition Arousal/Alertness: Awake/alert Behavior During Therapy: WFL for tasks assessed/performed Overall Cognitive Status: Within Functional Limits for tasks assessed                                 General Comments: decreased awareness of safety and deficits        Exercises Other Exercises Other Exercises: Educated son on AROM exercised for pt elbow and distally with son A'ing her or S her. Also educated pt's son on PROM exercises in supine (recliner leaned back) for shoulder FF (no more than 90 degrees, pt can get to ~30 degrees), ABD no more  than 60 degrees (pt tolerating full 60 degrees), and ext rotation (no more than 30 degrees past neutral--pt tolerating to neutral). Handouts provided on these as well as pendums that pt can progress to with [facility] therapists. Shoulder exercises also on [employer] with pt's code on handout.           Pertinent Vitals/ Pain       Pain Assessment: Faces Faces Pain Scale: Hurts little more Pain Location: LUE--when taken out of sling Pain Descriptors / Indicators: Sore;Aching;Grimacing Pain Intervention(s): Limited activity within patient's tolerance;Monitored during session;Repositioned         Frequency  Min 4X/week         Progress Toward Goals  OT Goals(current goals can now be found in the care plan section)  Progress towards OT goals: Progressing toward goals  Acute Rehab OT Goals Patient Stated Goal: to be able to walk   Plan Discharge plan remains appropriate       AM-PAC OT "6 Clicks" Daily Activity     Outcome Measure   Help from another person eating meals?: None Help from another person taking care of personal grooming?: A Lot Help from another person toileting, which includes using toliet, bedpan, or urinal?: A Lot Help from another person bathing (including washing, rinsing, drying)?: A Lot Help from another person to put on and taking off regular upper body clothing?: A Lot Help from another person to put on and taking off regular lower body clothing?: Total 6 Click Score: 13    End of Session Equipment Utilized During Treatment: Gait belt  OT Visit Diagnosis: Unsteadiness on feet (R26.81);Pain;History of falling (Z91.81);Muscle weakness (generalized) (M62.81) Pain - Right/Left: Left Pain - part of body: Shoulder   Activity Tolerance Patient tolerated treatment well   Patient Left in chair;with family/visitor present   Nurse Communication Mobility status(pt ready to go)        Time: 3888-2800 OT Time Calculation (min): 28 min  Charges: OT General Charges $OT Visit: 1 Visit OT Treatments $Self Care/Home Management : 8-22 mins $Therapeutic Exercise: 8-22 mins  [place], OTR/L Acute [employer] Pager [phone] Office [phone]      [name] 12/10/2018, 5:07 PM

## 2018-12-10 NOTE — TOC Transition Note (Signed)
Transition of Care [facility]) - CM/SW Discharge Note   Patient Details  Name: [name] MRN: [id number] Date of Birth: 10/06/1927  [facility] CM/SW Contact:  Bartholomew Crews, RN Phone Number: [phone] 12/10/2018, 9:12 AM   Clinical Narrative:    Patient to transition home with [facility] for PT and OT. CSW spoke with daughter to confirm plan. Noted HH orders/Face to Face placed 12/06/2018. No other transition of care need identified.    Final next level of care: [place] Barriers to Discharge: No Barriers Identified   Patient Goals and CMS Choice Patient states their goals for this hospitalization and ongoing recovery are:: to get back home CMS Medicare.gov Compare Post Acute Care list provided to:: Patient Choice offered to / list presented to : Patient  Discharge Placement                       Discharge Plan and Services   Discharge Planning Services: CM Consult            DME Arranged: N/A DME Agency: NA       HH Arranged: PT, OT [place] Agency: [facility] (formerly [employer]) Date [place]: 12/10/18 Time [place]: [phone] Representative spoke with at [place]: [place] ([place]) Interventions     Readmission Risk Interventions No flowsheet data found.

## 2018-12-10 NOTE — Progress Notes (Addendum)
Physical Therapy Treatment Patient Details Name: [name] MRN: [id number] DOB: 1928/01/03 Today's Date: 12/10/2018    History of Present Illness 85 year old female with left shoulder pain.  She had an injury about 2 weeks ago.  Has comminuted complex unfixable displaced proximal humerus fracture.  Now s/p Left reverse shoulder replacement. PMH includes, but not limited to, CAD, cancer, HTN, Chronic kidney disease, COPD, arthritis, Hypothyroidism.    PT Comments    Pt progressing much better this pm.  She is performing tasks with +1 physical assistance and +2 only for safety.  Pt continue to present with incontinence in standing.  Pt plans to return home with her daughter.  OT in post session to educate the brother on sling placement.  Will update recommendations to HHPT at this time.      Follow Up Recommendations  Supervision/Assistance - 24 hour;Home health PT     Equipment Recommendations  None recommended by PT    Recommendations for Other Services       Precautions / Restrictions Precautions Precautions: Shoulder;Fall Shoulder Interventions: At all times;Off for dressing/bathing/exercises;Shoulder abduction pillow Precaution Comments: Okay for left shoulder passive range of motion and active assisted range of motion to pain tolerance (order say 30 ER, 60 Abd, and 90 FF), pendulums OK, elbow-wrist-hand OK Required Braces or Orthoses: Sling Restrictions Weight Bearing Restrictions: Yes LUE Weight Bearing: Non weight bearing Other Position/Activity Restrictions: but can use RW with LUE per Dr. Randel Pigg discharge instructions    Mobility  Bed Mobility Overal bed mobility: Needs Assistance Bed Mobility: Supine to Sit     Supine to sit: Min assist;HOB elevated     General bed mobility comments: Pt required min assistance to move to edge of bed.  Once edge of bed balance much improved from previous session.  Transfers Overall transfer level: Needs  assistance Equipment used: Rolling walker (2 wheeled) Transfers: Sit to/from Stand Sit to Stand: Mod assist Stand pivot transfers: Mod assist;+2 safety/equipment       General transfer comment: Pt able to power up into standing with mod +1 and R HHA.  To move from bed to chair patient required mod +2 for safety.  Pt presents with urinary incontinience on standing.  Ambulation/Gait Ambulation/Gait assistance: Mod assist;+2 safety/equipment Gait Distance (Feet): (steps from bed to recliner chair with R HHA and moderate assistance.)             Stairs             Wheelchair Mobility    Modified Rankin (Stroke Patients Only)       Balance                                            Cognition Arousal/Alertness: Awake/alert Behavior During Therapy: WFL for tasks assessed/performed Overall Cognitive Status: Within Functional Limits for tasks assessed                                 General Comments: decreased awareness of safety and deficits      Exercises General Exercises - Lower Extremity Ankle Circles/Pumps: AROM;Both;10 reps;Supine Long Arc Quad: AROM;Both;10 reps;Seated Hip ABduction/ADduction: Both;10 reps;AAROM;Supine Hip Flexion/Marching: AROM;Both;10 reps;Seated    General Comments        Pertinent Vitals/Pain Pain Assessment: Faces Faces Pain Scale: Hurts a little bit Pain Location:  LUE Pain Descriptors / Indicators: Sore Pain Intervention(s): Monitored during session;Repositioned    Home Living                      Prior Function            PT Goals (current goals can now be found in the care plan section) Acute Rehab PT Goals Patient Stated Goal: to be able to walk  PT Goal Formulation: With patient Potential to Achieve Goals: Fair Progress towards PT goals: Progressing toward goals    Frequency    Min 5X/week      PT Plan Current plan remains appropriate    Co-evaluation               AM-PAC PT "6 Clicks" Mobility   Outcome Measure  Help needed turning from your back to your side while in a flat bed without using bedrails?: A Little Help needed moving from lying on your back to sitting on the side of a flat bed without using bedrails?: A Little Help needed moving to and from a bed to a chair (including a wheelchair)?: A Lot Help needed standing up from a chair using your arms (e.g., wheelchair or bedside chair)?: A Lot Help needed to walk in hospital room?: A Lot Help needed climbing 3-5 steps with a railing? : A Lot 6 Click Score: 14    End of Session   Activity Tolerance: Patient tolerated treatment well Patient left: in chair;with call bell/phone within reach Nurse Communication: Mobility status PT Visit Diagnosis: Muscle weakness (generalized) (M62.81);Difficulty in walking, not elsewhere classified (R26.2);Unsteadiness on feet (R26.81);History of falling (Z91.81)     Time: 1350-1407 PT Time Calculation (min) (ACUTE ONLY): 17 min  Charges:  $Therapeutic Activity: 8-22 mins                     Governor Rooks, PTA Acute Rehabilitation Services Pager [phone] Office [phone]     Cassidee Deats Eli Hose 12/10/2018, 2:34 PM

## 2018-12-10 NOTE — TOC Progression Note (Addendum)
Transition of Care [facility]) - Progression Note    Patient Details  Name: [name] MRN: [id number] Date of Birth: Dec 05, 1927  Transition of Care [facility]) CM/SW [place], [place] Phone Number: 12/10/2018, 9:01 AM  Clinical Narrative:     CSW spoke with patient who reports she is refusing SNF rec at this time and is going home. She reports she has plenty of support at home. CSW called daughter Silva Bandy who verifies that she has been providing patient with 24/7 assistance and supervision for the last 4 months and will continue with her brother Arnell Sieving also assisting. She reports wanting to continue with [facility] PT and assistance. She reports Arnell Sieving will be at the hospital at 2:00 pm to transport patient home. Nurse made aware.   Expected discharge plan: Home with [facility] Barriers to Discharge: No barriers identified.  Expected Discharge Plan and Services Expected discharge plan: Home with [facility]   Discharge Planning Services: CM Consult   Living arrangements for the past 2 months: Single Family Home Expected Discharge Date: 12/10/18                         HH Arranged: PT, OT [place] Agency: [place] (now [facility]) Date [place]: 12/09/18 Time [place]: [phone] Representative spoke with at [place]: [place] ([place]) Interventions    Readmission Risk Interventions No flowsheet data found.

## 2018-12-11 NOTE — Discharge Summary (Signed)
Physician Discharge Summary      Patient ID: [name] MRN: [id number] DOB/AGE: 17-Nov-1927 85 y.o.  Admit date: 12/06/2018 Discharge date: 12/10/2018 99  Admission Diagnoses:  Active Problems:   Proximal humerus fracture   Discharge Diagnoses:  Same  Surgeries: Procedure(s): LEFT REVERSE SHOULDER ARTHROPLASTY on 12/06/2018   Consultants:   Discharged Condition: Stable  Hospital Course: [name] is an 85 y.o. female who was admitted 12/06/2018 with a chief complaint of left shoulder pain, and found to have a diagnosis of proximal left humerus fracture.  They were brought to the operating room on 12/06/2018 and underwent the above named procedures.  Patient was slow to rehabilitate in physical therapy.  She was discharged to skilled nursing in good condition.  She will continue with range of motion exercises.  Discharged home on pain medicine and muscle relaxers and will follow-up with me in clinic in approximately 10 days Antibiotics given:  Anti-infectives (From admission, onward)   Start     Dose/Rate Route Frequency Ordered Stop   12/06/18 1400  ceFAZolin (ANCEF) IVPB 2g/100 mL premix     2 g 200 mL/hr over 30 Minutes Intravenous Every 6 hours 12/06/18 1305 12/06/18 2036   12/06/18 1030  vancomycin (VANCOCIN) powder  Status:  Discontinued       As needed 12/06/18 1028 12/06/18 1139   12/06/18 0630  ceFAZolin (ANCEF) IVPB 2g/100 mL premix     2 g 200 mL/hr over 30 Minutes Intravenous On call to O.R. 12/06/18 4287 12/06/18 0746    .  Recent vital signs:  Vitals:   12/10/18 0456 12/10/18 0824  BP: 117/68 (!) 152/53  Pulse: 72 75  Resp: 18 16  Temp: 98.4 F (36.9 C) 97.9 F (36.6 C)  SpO2: 98% 94%    Recent laboratory studies:  Results for orders placed or performed during the hospital encounter of 12/06/18  Urinalysis, Routine w reflex microscopic  Result Value Ref Range   Color, Urine YELLOW YELLOW   APPearance CLEAR CLEAR   Specific Gravity, Urine  1.009 1.005 - 1.030   pH 5.0 5.0 - 8.0   Glucose, UA NEGATIVE NEGATIVE mg/dL   Hgb urine dipstick NEGATIVE NEGATIVE   Bilirubin Urine NEGATIVE NEGATIVE   Ketones, ur NEGATIVE NEGATIVE mg/dL   Protein, ur NEGATIVE NEGATIVE mg/dL   Nitrite POSITIVE (A) NEGATIVE   Leukocytes,Ua LARGE (A) NEGATIVE   RBC / HPF 0-5 0 - 5 RBC/hpf   WBC, UA >50 (H) 0 - 5 WBC/hpf   Bacteria, UA MANY (A) NONE SEEN   Squamous Epithelial / LPF 0-5 0 - 5   WBC Clumps PRESENT   CBC  Result Value Ref Range   WBC 16.7 (H) 4.0 - 10.5 K/uL   RBC 3.28 (L) 3.87 - 5.11 MIL/uL   Hemoglobin 9.5 (L) 12.0 - 15.0 g/dL   HCT 29.9 (L) 36.0 - 46.0 %   MCV 91.2 80.0 - 100.0 fL   MCH 29.0 26.0 - 34.0 pg   MCHC 31.8 30.0 - 36.0 g/dL   RDW 14.1 11.5 - 15.5 %   Platelets 244 150 - 400 K/uL   nRBC 0.0 0.0 - 0.2 %  Hemoglobin and hematocrit, blood  Result Value Ref Range   Hemoglobin 9.3 (L) 12.0 - 15.0 g/dL   HCT 30.2 (L) 36.0 - 46.0 %  CBC  Result Value Ref Range   WBC 12.9 (H) 4.0 - 10.5 K/uL   RBC 2.65 (L) 3.87 - 5.11 MIL/uL  Hemoglobin 7.7 (L) 12.0 - 15.0 g/dL   HCT 25.1 (L) 36.0 - 46.0 %   MCV 94.7 80.0 - 100.0 fL   MCH 29.1 26.0 - 34.0 pg   MCHC 30.7 30.0 - 36.0 g/dL   RDW 14.1 11.5 - 15.5 %   Platelets 232 150 - 400 K/uL   nRBC 0.0 0.0 - 0.2 %  Prepare RBC  Result Value Ref Range   Order Confirmation      ORDER PROCESSED BY BLOOD BANK Performed at [facility], [place] [address], [place], [place] [address]   Type and screen [place]  Result Value Ref Range   ABO/RH(D) A POS    Antibody Screen NEG    Sample Expiration 12/10/2018,2359    Unit Number [id number]    Blood Component Type RED CELLS,LR    Unit division 00    Status of Unit ISSUED,FINAL    Transfusion Status OK TO TRANSFUSE    Crossmatch Result      Compatible Performed at [facility], [place] [address], [place], [place] [address]   ABO/Rh  Result Value Ref Range   ABO/RH(D)      A POS Performed at [facility] [address], [place], [place] [address]   BPAM [facility]  Result Value Ref Range   ISSUE DATE / TIME [id number]    Blood Product Unit Number [id number]    PRODUCT CODE [id number]    Unit Type and Rh 0600    Blood Product Expiration Date [id number]     Discharge Medications:   Allergies as of 12/10/2018      Reactions   Latex Shortness Of Breath, Rash   Lidocaine    Procaine Hcl    Sulfonamide Derivatives       Medication List    STOP taking these medications   ibuprofen 200 MG tablet Commonly known as: ADVIL     TAKE these medications   acebutolol 200 MG capsule Commonly known as: SECTRAL Take 200 mg by mouth daily.   albuterol (2.5 MG/3ML) 0.083% nebulizer solution Commonly known as: PROVENTIL Take 2.5 mg by nebulization every 6 (six) hours as needed for wheezing or shortness of breath.   amLODipine 2.5 MG tablet Commonly known as: NORVASC Take 2.5 mg by mouth daily.   aspirin EC 81 MG tablet Take 81 mg by mouth daily.   atorvastatin 80 MG tablet Commonly known as: LIPITOR Take 80 mg by mouth at bedtime.   celecoxib 100 MG capsule Commonly known as: CELEBREX Take 100 mg by mouth 2 (two) times a day.   cholecalciferol 25 MCG (1000 UT) tablet Commonly known as: VITAMIN D Take 1,000 Units by mouth daily.   diphenhydrAMINE 25 MG tablet Commonly known as: BENADRYL Take 25 mg by mouth every 6 (six) hours as needed for allergies.   esomeprazole 20 MG capsule Commonly known as: NEXIUM Take 20 mg by mouth daily.   furosemide 20 MG tablet Commonly known as: LASIX Take 20-40 mg by mouth See admin instructions. Take 40 mg by mouth in the morning and 20 mg in the evening   HYDROcodone-acetaminophen 5-325 MG tablet Commonly known as: NORCO/VICODIN Take 1 tablet by mouth every 6 (six) hours as needed for moderate pain or severe pain.   ICY HOT EX Apply 1 application topically daily as needed (pain).   Klor-Con M20 20 MEQ tablet Generic drug:  potassium chloride SA Take 20 mEq by mouth every morning.  levothyroxine 100 MCG tablet Commonly known as: SYNTHROID Take 100 mcg by mouth daily before breakfast.   methocarbamol 500 MG tablet Commonly known as: ROBAXIN Take 1 tablet (500 mg total) by mouth every 8 (eight) hours as needed for muscle spasms.   multivitamin with minerals Tabs tablet Take 1 tablet by mouth daily.   OLANZapine 10 MG tablet Commonly known as: ZYPREXA Take 10 mg by mouth at bedtime.   rOPINIRole 0.5 MG tablet Commonly known as: REQUIP Take 0.5-1 mg by mouth at bedtime.       Diagnostic Studies: Ct Shoulder Left Wo Contrast  Result Date: 11/27/2018 CLINICAL DATA:  The patient suffered a proximal left humerus fracture in a fall 11/18/2018. Initial encounter. EXAM: CT OF THE UPPER LEFT EXTREMITY WITHOUT CONTRAST TECHNIQUE: Multidetector CT imaging of the upper left extremity was performed according to the standard protocol. COMPARISON:  Plain films left shoulder 11/18/2018. FINDINGS: Bones/Joint/Cartilage Again seen is a comminuted surgical neck fracture of the left humerus. There is approximately 1 shaft width anterior displacement of the humeral shaft. Fragment override of approximately 3 cm is identified. The patient's fracture involves the posterior aspect of the greater tuberosity which demonstrates mild anterior and medial displacement. The lesser tuberosity is spared. A small amount of callus formation is seen about the anterior aspect of the patient's fracture. Inferior subluxation of the humeral head is due to a joint effusion. The acromioclavicular joint is intact with mild degenerative change present. Type 1 acromion with some subacromial spurring noted. Ligaments Suboptimally assessed by CT. Muscles and Tendons The rotator cuff appears intact. Retraction on the humeral head results in superior angulation of the greater tuberosity. Soft tissues Imaged lung parenchyma is clear. Atherosclerotic vascular  disease is noted. IMPRESSION: Comminuted surgical neck fracture of the left humerus with impaction and anterior displacement of the shaft of the humerus. The greater tuberosity is medially rotated due to retraction by the rotator cuff. The fracture extends into the posterior aspect of the greater tuberosity. There is a small amount of callus formation about the fracture but no bridging bone is identified. Inferior subluxation of the humeral head is due to a joint glenohumeral effusion. Mild acromioclavicular osteoarthritis. Small subacromial spur noted. Electronically Signed   By: Inge Rise M.D.   On: 11/27/2018 14:18   Dg Shoulder Left Port  Result Date: 12/06/2018 CLINICAL DATA:  Left shoulder arthroplasty. EXAM: LEFT SHOULDER - 1 VIEW COMPARISON:  CT left shoulder dated November 27, 2018. FINDINGS: Interval left reverse total shoulder arthroplasty. Components are well aligned. No acute osseous abnormality. Expected postsurgical changes and subcutaneous emphysema around the left shoulder. Old left-sided rib fractures. IMPRESSION: 1. Interval left reverse total shoulder arthroplasty without acute postoperative complication. Electronically Signed   By: Titus Dubin M.D.   On: 12/06/2018 14:18    Disposition:   Discharge Instructions    Call MD / Call 911   Complete by: As directed    If you experience chest pain or shortness of breath, CALL 911 and be transported to the hospital emergency room.  If you develope a fever above 101 F, pus (white drainage) or increased drainage or redness at the wound, or calf pain, call your surgeon's office.   Call MD / Call 911   Complete by: As directed    If you experience chest pain or shortness of breath, CALL 911 and be transported to the hospital emergency room.  If you develope a fever above 101 F, pus (white drainage) or increased drainage  or redness at the wound, or calf pain, call your surgeon's office.   Constipation Prevention   Complete by: As  directed    Drink plenty of fluids.  Prune juice may be helpful.  You may use a stool softener, such as Colace (over the counter) 100 mg twice a day.  Use MiraLax (over the counter) for constipation as needed.   Constipation Prevention   Complete by: As directed    Drink plenty of fluids.  Prune juice may be helpful.  You may use a stool softener, such as Colace (over the counter) 100 mg twice a day.  Use MiraLax (over the counter) for constipation as needed.   Diet - low sodium heart healthy   Complete by: As directed    Diet - low sodium heart healthy   Complete by: As directed    Discharge instructions   Complete by: As directed    No lifting with left arm Okay to shower dressing waterproof Use CPM machine 1 hour 3 times a day for passive motion only   Discharge instructions   Complete by: As directed    Okay for left shoulder passive range of motion and active assisted range of motion to pain tolerance Okay to use the left arm with a walker Follow-up in 10 days.  Okay to shower dressing is waterproof   Increase activity slowly as tolerated   Complete by: As directed    Increase activity slowly as tolerated   Complete by: As directed       Follow-up Information    [facility].   Specialty: [place] Why: someone will call you to follow up with physical and occupational therapy Contact information: [place] [address] [phone]            Signed: Anderson Malta 12/11/2018, 3:04 PM

## 2018-12-12 DIAGNOSIS — S42202D Unspecified fracture of upper end of left humerus, subsequent encounter for fracture with routine healing: Secondary | ICD-10-CM | POA: Diagnosis not present

## 2018-12-12 DIAGNOSIS — E039 Hypothyroidism, unspecified: Secondary | ICD-10-CM | POA: Diagnosis not present

## 2018-12-12 DIAGNOSIS — M4800 Spinal stenosis, site unspecified: Secondary | ICD-10-CM | POA: Diagnosis not present

## 2018-12-12 DIAGNOSIS — I2510 Atherosclerotic heart disease of native coronary artery without angina pectoris: Secondary | ICD-10-CM | POA: Diagnosis not present

## 2018-12-12 DIAGNOSIS — J449 Chronic obstructive pulmonary disease, unspecified: Secondary | ICD-10-CM | POA: Diagnosis not present

## 2018-12-12 DIAGNOSIS — I129 Hypertensive chronic kidney disease with stage 1 through stage 4 chronic kidney disease, or unspecified chronic kidney disease: Secondary | ICD-10-CM | POA: Diagnosis not present

## 2018-12-12 DIAGNOSIS — N189 Chronic kidney disease, unspecified: Secondary | ICD-10-CM | POA: Diagnosis not present

## 2018-12-12 DIAGNOSIS — F419 Anxiety disorder, unspecified: Secondary | ICD-10-CM | POA: Diagnosis not present

## 2018-12-12 DIAGNOSIS — F329 Major depressive disorder, single episode, unspecified: Secondary | ICD-10-CM | POA: Diagnosis not present

## 2018-12-13 DIAGNOSIS — J449 Chronic obstructive pulmonary disease, unspecified: Secondary | ICD-10-CM | POA: Diagnosis not present

## 2018-12-13 DIAGNOSIS — S42202D Unspecified fracture of upper end of left humerus, subsequent encounter for fracture with routine healing: Secondary | ICD-10-CM | POA: Diagnosis not present

## 2018-12-13 DIAGNOSIS — F329 Major depressive disorder, single episode, unspecified: Secondary | ICD-10-CM | POA: Diagnosis not present

## 2018-12-13 DIAGNOSIS — I2510 Atherosclerotic heart disease of native coronary artery without angina pectoris: Secondary | ICD-10-CM | POA: Diagnosis not present

## 2018-12-13 DIAGNOSIS — E039 Hypothyroidism, unspecified: Secondary | ICD-10-CM | POA: Diagnosis not present

## 2018-12-13 DIAGNOSIS — I129 Hypertensive chronic kidney disease with stage 1 through stage 4 chronic kidney disease, or unspecified chronic kidney disease: Secondary | ICD-10-CM | POA: Diagnosis not present

## 2018-12-13 DIAGNOSIS — F419 Anxiety disorder, unspecified: Secondary | ICD-10-CM | POA: Diagnosis not present

## 2018-12-13 DIAGNOSIS — N189 Chronic kidney disease, unspecified: Secondary | ICD-10-CM | POA: Diagnosis not present

## 2018-12-13 DIAGNOSIS — M4800 Spinal stenosis, site unspecified: Secondary | ICD-10-CM | POA: Diagnosis not present

## 2018-12-17 ENCOUNTER — Telehealth: Payer: Self-pay | Admitting: Orthopedic Surgery

## 2018-12-17 DIAGNOSIS — I129 Hypertensive chronic kidney disease with stage 1 through stage 4 chronic kidney disease, or unspecified chronic kidney disease: Secondary | ICD-10-CM | POA: Diagnosis not present

## 2018-12-17 DIAGNOSIS — N189 Chronic kidney disease, unspecified: Secondary | ICD-10-CM | POA: Diagnosis not present

## 2018-12-17 DIAGNOSIS — M4800 Spinal stenosis, site unspecified: Secondary | ICD-10-CM | POA: Diagnosis not present

## 2018-12-17 DIAGNOSIS — F329 Major depressive disorder, single episode, unspecified: Secondary | ICD-10-CM | POA: Diagnosis not present

## 2018-12-17 DIAGNOSIS — I2510 Atherosclerotic heart disease of native coronary artery without angina pectoris: Secondary | ICD-10-CM | POA: Diagnosis not present

## 2018-12-17 DIAGNOSIS — E039 Hypothyroidism, unspecified: Secondary | ICD-10-CM | POA: Diagnosis not present

## 2018-12-17 DIAGNOSIS — F419 Anxiety disorder, unspecified: Secondary | ICD-10-CM | POA: Diagnosis not present

## 2018-12-17 DIAGNOSIS — J449 Chronic obstructive pulmonary disease, unspecified: Secondary | ICD-10-CM | POA: Diagnosis not present

## 2018-12-17 DIAGNOSIS — S42202D Unspecified fracture of upper end of left humerus, subsequent encounter for fracture with routine healing: Secondary | ICD-10-CM | POA: Diagnosis not present

## 2018-12-17 NOTE — Telephone Encounter (Signed)
IC verbal given.  

## 2018-12-17 NOTE — Telephone Encounter (Signed)
[name] with [facility] called in requesting verbal order for 1 time a week for 1 week and 2 times a week for 3 weeks and 1 times a week for 5 weeks.   [phone]

## 2018-12-18 DIAGNOSIS — I2510 Atherosclerotic heart disease of native coronary artery without angina pectoris: Secondary | ICD-10-CM | POA: Diagnosis not present

## 2018-12-18 DIAGNOSIS — N189 Chronic kidney disease, unspecified: Secondary | ICD-10-CM | POA: Diagnosis not present

## 2018-12-18 DIAGNOSIS — E039 Hypothyroidism, unspecified: Secondary | ICD-10-CM | POA: Diagnosis not present

## 2018-12-18 DIAGNOSIS — F419 Anxiety disorder, unspecified: Secondary | ICD-10-CM | POA: Diagnosis not present

## 2018-12-18 DIAGNOSIS — S42202D Unspecified fracture of upper end of left humerus, subsequent encounter for fracture with routine healing: Secondary | ICD-10-CM | POA: Diagnosis not present

## 2018-12-18 DIAGNOSIS — F329 Major depressive disorder, single episode, unspecified: Secondary | ICD-10-CM | POA: Diagnosis not present

## 2018-12-18 DIAGNOSIS — J449 Chronic obstructive pulmonary disease, unspecified: Secondary | ICD-10-CM | POA: Diagnosis not present

## 2018-12-18 DIAGNOSIS — I129 Hypertensive chronic kidney disease with stage 1 through stage 4 chronic kidney disease, or unspecified chronic kidney disease: Secondary | ICD-10-CM | POA: Diagnosis not present

## 2018-12-18 DIAGNOSIS — M4800 Spinal stenosis, site unspecified: Secondary | ICD-10-CM | POA: Diagnosis not present

## 2018-12-19 ENCOUNTER — Telehealth: Payer: Self-pay

## 2018-12-19 NOTE — Telephone Encounter (Signed)
IC Advised per Dr Marlou Sa She stated that she would call PCP but felt that they would not see her without an office visit and that she was not going to her PCP until she saw Dr Marlou Sa on Friday.

## 2018-12-19 NOTE — Telephone Encounter (Signed)
I think taking Lasix is a good idea but I would favor having that done under the care of her primary care physician..  Drinking water maybe not so much but taking Lasix for pitting edema is a good idea.

## 2018-12-19 NOTE — Telephone Encounter (Signed)
Please advise. Thanks.  

## 2018-12-19 NOTE — Telephone Encounter (Signed)
Patient's daughter Silva Bandy called stating that patient has built up fluid in her legs and feet that is more than normal.  Stated that she was advised by the therapists to increase her water intake, but that hasn't helped.  Patient has been going to the bathroom. Would like to know what else can be done?  Stated that patient has taken Lasix in the past.  Cb# is [phone].  Please advise.  Thank You.

## 2018-12-21 ENCOUNTER — Ambulatory Visit (INDEPENDENT_AMBULATORY_CARE_PROVIDER_SITE_OTHER): Payer: Medicare HMO

## 2018-12-21 ENCOUNTER — Encounter: Payer: Self-pay | Admitting: Orthopedic Surgery

## 2018-12-21 ENCOUNTER — Other Ambulatory Visit: Payer: Self-pay

## 2018-12-21 ENCOUNTER — Ambulatory Visit (INDEPENDENT_AMBULATORY_CARE_PROVIDER_SITE_OTHER): Payer: Medicare HMO | Admitting: Orthopedic Surgery

## 2018-12-21 DIAGNOSIS — M4800 Spinal stenosis, site unspecified: Secondary | ICD-10-CM | POA: Diagnosis not present

## 2018-12-21 DIAGNOSIS — Z96612 Presence of left artificial shoulder joint: Secondary | ICD-10-CM

## 2018-12-21 DIAGNOSIS — J449 Chronic obstructive pulmonary disease, unspecified: Secondary | ICD-10-CM | POA: Diagnosis not present

## 2018-12-21 DIAGNOSIS — E039 Hypothyroidism, unspecified: Secondary | ICD-10-CM | POA: Diagnosis not present

## 2018-12-21 DIAGNOSIS — N189 Chronic kidney disease, unspecified: Secondary | ICD-10-CM | POA: Diagnosis not present

## 2018-12-21 DIAGNOSIS — I129 Hypertensive chronic kidney disease with stage 1 through stage 4 chronic kidney disease, or unspecified chronic kidney disease: Secondary | ICD-10-CM | POA: Diagnosis not present

## 2018-12-21 DIAGNOSIS — F419 Anxiety disorder, unspecified: Secondary | ICD-10-CM | POA: Diagnosis not present

## 2018-12-21 DIAGNOSIS — F329 Major depressive disorder, single episode, unspecified: Secondary | ICD-10-CM | POA: Diagnosis not present

## 2018-12-21 DIAGNOSIS — S42202D Unspecified fracture of upper end of left humerus, subsequent encounter for fracture with routine healing: Secondary | ICD-10-CM | POA: Diagnosis not present

## 2018-12-21 DIAGNOSIS — I2510 Atherosclerotic heart disease of native coronary artery without angina pectoris: Secondary | ICD-10-CM | POA: Diagnosis not present
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
## 2018-12-25 DIAGNOSIS — E039 Hypothyroidism, unspecified: Secondary | ICD-10-CM | POA: Diagnosis not present

## 2018-12-25 DIAGNOSIS — F419 Anxiety disorder, unspecified: Secondary | ICD-10-CM | POA: Diagnosis not present

## 2018-12-25 DIAGNOSIS — I2510 Atherosclerotic heart disease of native coronary artery without angina pectoris: Secondary | ICD-10-CM | POA: Diagnosis not present

## 2018-12-25 DIAGNOSIS — S42202D Unspecified fracture of upper end of left humerus, subsequent encounter for fracture with routine healing: Secondary | ICD-10-CM | POA: Diagnosis not present

## 2018-12-25 DIAGNOSIS — N189 Chronic kidney disease, unspecified: Secondary | ICD-10-CM | POA: Diagnosis not present

## 2018-12-25 DIAGNOSIS — F329 Major depressive disorder, single episode, unspecified: Secondary | ICD-10-CM | POA: Diagnosis not present

## 2018-12-25 DIAGNOSIS — M4800 Spinal stenosis, site unspecified: Secondary | ICD-10-CM | POA: Diagnosis not present

## 2018-12-25 DIAGNOSIS — J449 Chronic obstructive pulmonary disease, unspecified: Secondary | ICD-10-CM | POA: Diagnosis not present

## 2018-12-25 DIAGNOSIS — I129 Hypertensive chronic kidney disease with stage 1 through stage 4 chronic kidney disease, or unspecified chronic kidney disease: Secondary | ICD-10-CM | POA: Diagnosis not present

## 2018-12-26 DIAGNOSIS — F329 Major depressive disorder, single episode, unspecified: Secondary | ICD-10-CM | POA: Diagnosis not present

## 2018-12-26 DIAGNOSIS — E039 Hypothyroidism, unspecified: Secondary | ICD-10-CM | POA: Diagnosis not present

## 2018-12-26 DIAGNOSIS — M4800 Spinal stenosis, site unspecified: Secondary | ICD-10-CM | POA: Diagnosis not present

## 2018-12-26 DIAGNOSIS — J449 Chronic obstructive pulmonary disease, unspecified: Secondary | ICD-10-CM | POA: Diagnosis not present

## 2018-12-26 DIAGNOSIS — N189 Chronic kidney disease, unspecified: Secondary | ICD-10-CM | POA: Diagnosis not present

## 2018-12-26 DIAGNOSIS — F419 Anxiety disorder, unspecified: Secondary | ICD-10-CM | POA: Diagnosis not present

## 2018-12-26 DIAGNOSIS — S42202D Unspecified fracture of upper end of left humerus, subsequent encounter for fracture with routine healing: Secondary | ICD-10-CM | POA: Diagnosis not present

## 2018-12-26 DIAGNOSIS — I2510 Atherosclerotic heart disease of native coronary artery without angina pectoris: Secondary | ICD-10-CM | POA: Diagnosis not present

## 2018-12-26 DIAGNOSIS — I129 Hypertensive chronic kidney disease with stage 1 through stage 4 chronic kidney disease, or unspecified chronic kidney disease: Secondary | ICD-10-CM | POA: Diagnosis not present

## 2018-12-28 DIAGNOSIS — E039 Hypothyroidism, unspecified: Secondary | ICD-10-CM | POA: Diagnosis not present

## 2018-12-28 DIAGNOSIS — F329 Major depressive disorder, single episode, unspecified: Secondary | ICD-10-CM | POA: Diagnosis not present

## 2018-12-28 DIAGNOSIS — S42202D Unspecified fracture of upper end of left humerus, subsequent encounter for fracture with routine healing: Secondary | ICD-10-CM | POA: Diagnosis not present

## 2018-12-28 DIAGNOSIS — J449 Chronic obstructive pulmonary disease, unspecified: Secondary | ICD-10-CM | POA: Diagnosis not present

## 2018-12-28 DIAGNOSIS — I129 Hypertensive chronic kidney disease with stage 1 through stage 4 chronic kidney disease, or unspecified chronic kidney disease: Secondary | ICD-10-CM | POA: Diagnosis not present

## 2018-12-28 DIAGNOSIS — M4800 Spinal stenosis, site unspecified: Secondary | ICD-10-CM | POA: Diagnosis not present

## 2018-12-28 DIAGNOSIS — N189 Chronic kidney disease, unspecified: Secondary | ICD-10-CM | POA: Diagnosis not present

## 2018-12-28 DIAGNOSIS — I2510 Atherosclerotic heart disease of native coronary artery without angina pectoris: Secondary | ICD-10-CM | POA: Diagnosis not present

## 2018-12-28 DIAGNOSIS — F419 Anxiety disorder, unspecified: Secondary | ICD-10-CM | POA: Diagnosis not present

## 2019-01-01 DIAGNOSIS — F419 Anxiety disorder, unspecified: Secondary | ICD-10-CM | POA: Diagnosis not present

## 2019-01-01 DIAGNOSIS — E039 Hypothyroidism, unspecified: Secondary | ICD-10-CM | POA: Diagnosis not present

## 2019-01-01 DIAGNOSIS — S42202D Unspecified fracture of upper end of left humerus, subsequent encounter for fracture with routine healing: Secondary | ICD-10-CM | POA: Diagnosis not present

## 2019-01-01 DIAGNOSIS — M4800 Spinal stenosis, site unspecified: Secondary | ICD-10-CM | POA: Diagnosis not present

## 2019-01-01 DIAGNOSIS — I2510 Atherosclerotic heart disease of native coronary artery without angina pectoris: Secondary | ICD-10-CM | POA: Diagnosis not present

## 2019-01-01 DIAGNOSIS — I129 Hypertensive chronic kidney disease with stage 1 through stage 4 chronic kidney disease, or unspecified chronic kidney disease: Secondary | ICD-10-CM | POA: Diagnosis not present

## 2019-01-01 DIAGNOSIS — F329 Major depressive disorder, single episode, unspecified: Secondary | ICD-10-CM | POA: Diagnosis not present

## 2019-01-01 DIAGNOSIS — J449 Chronic obstructive pulmonary disease, unspecified: Secondary | ICD-10-CM | POA: Diagnosis not present

## 2019-01-01 DIAGNOSIS — N189 Chronic kidney disease, unspecified: Secondary | ICD-10-CM | POA: Diagnosis not present

## 2019-01-02 DIAGNOSIS — E039 Hypothyroidism, unspecified: Secondary | ICD-10-CM | POA: Diagnosis not present

## 2019-01-02 DIAGNOSIS — S42202D Unspecified fracture of upper end of left humerus, subsequent encounter for fracture with routine healing: Secondary | ICD-10-CM | POA: Diagnosis not present

## 2019-01-02 DIAGNOSIS — J449 Chronic obstructive pulmonary disease, unspecified: Secondary | ICD-10-CM | POA: Diagnosis not present

## 2019-01-02 DIAGNOSIS — F329 Major depressive disorder, single episode, unspecified: Secondary | ICD-10-CM | POA: Diagnosis not present

## 2019-01-02 DIAGNOSIS — I129 Hypertensive chronic kidney disease with stage 1 through stage 4 chronic kidney disease, or unspecified chronic kidney disease: Secondary | ICD-10-CM | POA: Diagnosis not present

## 2019-01-02 DIAGNOSIS — N189 Chronic kidney disease, unspecified: Secondary | ICD-10-CM | POA: Diagnosis not present

## 2019-01-02 DIAGNOSIS — I2510 Atherosclerotic heart disease of native coronary artery without angina pectoris: Secondary | ICD-10-CM | POA: Diagnosis not present

## 2019-01-02 DIAGNOSIS — F419 Anxiety disorder, unspecified: Secondary | ICD-10-CM | POA: Diagnosis not present

## 2019-01-02 DIAGNOSIS — M4800 Spinal stenosis, site unspecified: Secondary | ICD-10-CM | POA: Diagnosis not present

## 2019-01-03 DIAGNOSIS — N189 Chronic kidney disease, unspecified: Secondary | ICD-10-CM | POA: Diagnosis not present

## 2019-01-03 DIAGNOSIS — I129 Hypertensive chronic kidney disease with stage 1 through stage 4 chronic kidney disease, or unspecified chronic kidney disease: Secondary | ICD-10-CM | POA: Diagnosis not present

## 2019-01-03 DIAGNOSIS — S42202D Unspecified fracture of upper end of left humerus, subsequent encounter for fracture with routine healing: Secondary | ICD-10-CM | POA: Diagnosis not present

## 2019-01-03 DIAGNOSIS — E039 Hypothyroidism, unspecified: Secondary | ICD-10-CM | POA: Diagnosis not present

## 2019-01-03 DIAGNOSIS — I2510 Atherosclerotic heart disease of native coronary artery without angina pectoris: Secondary | ICD-10-CM | POA: Diagnosis not present

## 2019-01-03 DIAGNOSIS — M4800 Spinal stenosis, site unspecified: Secondary | ICD-10-CM | POA: Diagnosis not present

## 2019-01-03 DIAGNOSIS — F329 Major depressive disorder, single episode, unspecified: Secondary | ICD-10-CM | POA: Diagnosis not present

## 2019-01-03 DIAGNOSIS — J449 Chronic obstructive pulmonary disease, unspecified: Secondary | ICD-10-CM | POA: Diagnosis not present

## 2019-01-03 DIAGNOSIS — F419 Anxiety disorder, unspecified: Secondary | ICD-10-CM | POA: Diagnosis not present

## 2019-01-07 DIAGNOSIS — R4182 Altered mental status, unspecified: Secondary | ICD-10-CM | POA: Diagnosis not present

## 2019-01-07 DIAGNOSIS — Z9981 Dependence on supplemental oxygen: Secondary | ICD-10-CM | POA: Diagnosis not present

## 2019-01-07 DIAGNOSIS — R109 Unspecified abdominal pain: Secondary | ICD-10-CM | POA: Diagnosis not present

## 2019-01-07 DIAGNOSIS — Z01818 Encounter for other preprocedural examination: Secondary | ICD-10-CM | POA: Diagnosis not present

## 2019-01-07 DIAGNOSIS — K651 Peritoneal abscess: Secondary | ICD-10-CM | POA: Diagnosis not present

## 2019-01-07 DIAGNOSIS — K567 Ileus, unspecified: Secondary | ICD-10-CM | POA: Diagnosis not present

## 2019-01-07 DIAGNOSIS — I10 Essential (primary) hypertension: Secondary | ICD-10-CM | POA: Diagnosis not present

## 2019-01-07 DIAGNOSIS — K5720 Diverticulitis of large intestine with perforation and abscess without bleeding: Secondary | ICD-10-CM | POA: Diagnosis not present

## 2019-01-07 DIAGNOSIS — G9341 Metabolic encephalopathy: Secondary | ICD-10-CM | POA: Diagnosis not present

## 2019-01-07 DIAGNOSIS — N7092 Oophoritis, unspecified: Secondary | ICD-10-CM | POA: Diagnosis not present

## 2019-01-07 DIAGNOSIS — R0602 Shortness of breath: Secondary | ICD-10-CM | POA: Diagnosis not present

## 2019-01-07 DIAGNOSIS — R1031 Right lower quadrant pain: Secondary | ICD-10-CM | POA: Diagnosis not present

## 2019-01-07 DIAGNOSIS — R0902 Hypoxemia: Secondary | ICD-10-CM | POA: Diagnosis not present

## 2019-01-07 DIAGNOSIS — K8050 Calculus of bile duct without cholangitis or cholecystitis without obstruction: Secondary | ICD-10-CM | POA: Diagnosis not present

## 2019-01-07 DIAGNOSIS — K838 Other specified diseases of biliary tract: Secondary | ICD-10-CM | POA: Diagnosis not present

## 2019-01-07 DIAGNOSIS — A419 Sepsis, unspecified organism: Secondary | ICD-10-CM | POA: Diagnosis not present

## 2019-01-07 DIAGNOSIS — R1032 Left lower quadrant pain: Secondary | ICD-10-CM | POA: Diagnosis not present

## 2019-01-07 DIAGNOSIS — J9690 Respiratory failure, unspecified, unspecified whether with hypoxia or hypercapnia: Secondary | ICD-10-CM | POA: Diagnosis not present

## 2019-01-07 DIAGNOSIS — D72829 Elevated white blood cell count, unspecified: Secondary | ICD-10-CM | POA: Diagnosis not present

## 2019-01-07 DIAGNOSIS — E039 Hypothyroidism, unspecified: Secondary | ICD-10-CM | POA: Diagnosis not present

## 2019-01-07 DIAGNOSIS — E871 Hypo-osmolality and hyponatremia: Secondary | ICD-10-CM | POA: Diagnosis not present

## 2019-01-07 DIAGNOSIS — B952 Enterococcus as the cause of diseases classified elsewhere: Secondary | ICD-10-CM | POA: Diagnosis not present

## 2019-01-07 DIAGNOSIS — R5383 Other fatigue: Secondary | ICD-10-CM | POA: Diagnosis not present

## 2019-01-07 DIAGNOSIS — K668 Other specified disorders of peritoneum: Secondary | ICD-10-CM | POA: Diagnosis not present

## 2019-01-07 DIAGNOSIS — J9811 Atelectasis: Secondary | ICD-10-CM | POA: Diagnosis not present

## 2019-01-07 DIAGNOSIS — D62 Acute posthemorrhagic anemia: Secondary | ICD-10-CM | POA: Diagnosis not present

## 2019-01-07 DIAGNOSIS — D649 Anemia, unspecified: Secondary | ICD-10-CM | POA: Diagnosis not present

## 2019-01-07 DIAGNOSIS — B9620 Unspecified Escherichia coli [E. coli] as the cause of diseases classified elsewhere: Secondary | ICD-10-CM | POA: Diagnosis not present

## 2019-01-07 DIAGNOSIS — R042 Hemoptysis: Secondary | ICD-10-CM | POA: Diagnosis not present

## 2019-01-07 DIAGNOSIS — K8020 Calculus of gallbladder without cholecystitis without obstruction: Secondary | ICD-10-CM | POA: Diagnosis not present

## 2019-01-07 DIAGNOSIS — K5732 Diverticulitis of large intestine without perforation or abscess without bleeding: Secondary | ICD-10-CM | POA: Diagnosis not present

## 2019-01-07 DIAGNOSIS — R918 Other nonspecific abnormal finding of lung field: Secondary | ICD-10-CM | POA: Diagnosis not present

## 2019-01-07 DIAGNOSIS — K921 Melena: Secondary | ICD-10-CM | POA: Diagnosis not present

## 2019-01-07 DIAGNOSIS — R531 Weakness: Secondary | ICD-10-CM | POA: Diagnosis not present

## 2019-01-08 ENCOUNTER — Telehealth: Payer: Self-pay

## 2019-01-08 DIAGNOSIS — K5720 Diverticulitis of large intestine with perforation and abscess without bleeding: Secondary | ICD-10-CM

## 2019-01-08 NOTE — Telephone Encounter (Signed)
Bummer thx for info

## 2019-01-08 NOTE — Telephone Encounter (Signed)
PT with [facility] called stating patients daughter had reached out to them (and asked them to relay information to you as well) that patient was having emergency surgery for ruptured polyp due to diverticulitis. She will be having surgery at [facility].

## 2019-01-10 DIAGNOSIS — E039 Hypothyroidism, unspecified: Secondary | ICD-10-CM

## 2019-01-10 DIAGNOSIS — R0902 Hypoxemia: Secondary | ICD-10-CM

## 2019-01-11 ENCOUNTER — Ambulatory Visit: Payer: Medicare HMO | Admitting: Orthopedic Surgery

## 2019-01-15 ENCOUNTER — Telehealth: Payer: Self-pay | Admitting: Orthopedic Surgery

## 2019-01-15 NOTE — Telephone Encounter (Signed)
Please advise thanks.

## 2019-01-15 NOTE — Telephone Encounter (Signed)
[place] for outpatient pt for rom or hhpt if needed for homebound

## 2019-01-15 NOTE — Telephone Encounter (Signed)
Patient's daughter called and stated that patient has been hospitalized and will be d/c either on 8/6 or 8/7. She wants to know if she should resume arm machine or PT/OT/RN are coming because of coloscopy bag. During the stay she has had no exercise on her arms at all. No pain in arm.  Daughter wants a call so she would know what to do from this point on.  [phone]

## 2019-01-16 NOTE — Telephone Encounter (Signed)
IC advised per Dr Dean 

## 2019-01-19 DIAGNOSIS — E039 Hypothyroidism, unspecified: Secondary | ICD-10-CM | POA: Diagnosis not present

## 2019-01-19 DIAGNOSIS — I2510 Atherosclerotic heart disease of native coronary artery without angina pectoris: Secondary | ICD-10-CM | POA: Diagnosis not present

## 2019-01-19 DIAGNOSIS — F329 Major depressive disorder, single episode, unspecified: Secondary | ICD-10-CM | POA: Diagnosis not present

## 2019-01-19 DIAGNOSIS — N189 Chronic kidney disease, unspecified: Secondary | ICD-10-CM | POA: Diagnosis not present

## 2019-01-19 DIAGNOSIS — S42202D Unspecified fracture of upper end of left humerus, subsequent encounter for fracture with routine healing: Secondary | ICD-10-CM | POA: Diagnosis not present

## 2019-01-19 DIAGNOSIS — M4800 Spinal stenosis, site unspecified: Secondary | ICD-10-CM | POA: Diagnosis not present

## 2019-01-19 DIAGNOSIS — F419 Anxiety disorder, unspecified: Secondary | ICD-10-CM | POA: Diagnosis not present

## 2019-01-19 DIAGNOSIS — I129 Hypertensive chronic kidney disease with stage 1 through stage 4 chronic kidney disease, or unspecified chronic kidney disease: Secondary | ICD-10-CM | POA: Diagnosis not present

## 2019-01-19 DIAGNOSIS — J449 Chronic obstructive pulmonary disease, unspecified: Secondary | ICD-10-CM | POA: Diagnosis not present

## 2019-01-22 DIAGNOSIS — Z09 Encounter for follow-up examination after completed treatment for conditions other than malignant neoplasm: Secondary | ICD-10-CM | POA: Diagnosis not present

## 2019-01-23 DIAGNOSIS — M4800 Spinal stenosis, site unspecified: Secondary | ICD-10-CM | POA: Diagnosis not present

## 2019-01-23 DIAGNOSIS — E039 Hypothyroidism, unspecified: Secondary | ICD-10-CM | POA: Diagnosis not present

## 2019-01-23 DIAGNOSIS — N189 Chronic kidney disease, unspecified: Secondary | ICD-10-CM | POA: Diagnosis not present

## 2019-01-23 DIAGNOSIS — F419 Anxiety disorder, unspecified: Secondary | ICD-10-CM | POA: Diagnosis not present

## 2019-01-23 DIAGNOSIS — F329 Major depressive disorder, single episode, unspecified: Secondary | ICD-10-CM | POA: Diagnosis not present

## 2019-01-23 DIAGNOSIS — S42202D Unspecified fracture of upper end of left humerus, subsequent encounter for fracture with routine healing: Secondary | ICD-10-CM | POA: Diagnosis not present

## 2019-01-23 DIAGNOSIS — I129 Hypertensive chronic kidney disease with stage 1 through stage 4 chronic kidney disease, or unspecified chronic kidney disease: Secondary | ICD-10-CM | POA: Diagnosis not present

## 2019-01-23 DIAGNOSIS — I2510 Atherosclerotic heart disease of native coronary artery without angina pectoris: Secondary | ICD-10-CM | POA: Diagnosis not present

## 2019-01-23 DIAGNOSIS — J449 Chronic obstructive pulmonary disease, unspecified: Secondary | ICD-10-CM | POA: Diagnosis not present

## 2019-01-24 DIAGNOSIS — S42202D Unspecified fracture of upper end of left humerus, subsequent encounter for fracture with routine healing: Secondary | ICD-10-CM | POA: Diagnosis not present

## 2019-01-24 DIAGNOSIS — N189 Chronic kidney disease, unspecified: Secondary | ICD-10-CM | POA: Diagnosis not present

## 2019-01-24 DIAGNOSIS — M4800 Spinal stenosis, site unspecified: Secondary | ICD-10-CM | POA: Diagnosis not present

## 2019-01-24 DIAGNOSIS — I129 Hypertensive chronic kidney disease with stage 1 through stage 4 chronic kidney disease, or unspecified chronic kidney disease: Secondary | ICD-10-CM | POA: Diagnosis not present

## 2019-01-24 DIAGNOSIS — F419 Anxiety disorder, unspecified: Secondary | ICD-10-CM | POA: Diagnosis not present

## 2019-01-24 DIAGNOSIS — I2510 Atherosclerotic heart disease of native coronary artery without angina pectoris: Secondary | ICD-10-CM | POA: Diagnosis not present

## 2019-01-24 DIAGNOSIS — F329 Major depressive disorder, single episode, unspecified: Secondary | ICD-10-CM | POA: Diagnosis not present

## 2019-01-24 DIAGNOSIS — J449 Chronic obstructive pulmonary disease, unspecified: Secondary | ICD-10-CM | POA: Diagnosis not present

## 2019-01-24 DIAGNOSIS — E039 Hypothyroidism, unspecified: Secondary | ICD-10-CM | POA: Diagnosis not present

## 2019-01-28 DIAGNOSIS — R069 Unspecified abnormalities of breathing: Secondary | ICD-10-CM | POA: Diagnosis not present

## 2019-01-28 DIAGNOSIS — R609 Edema, unspecified: Secondary | ICD-10-CM | POA: Diagnosis not present

## 2019-01-28 DIAGNOSIS — R0600 Dyspnea, unspecified: Secondary | ICD-10-CM | POA: Diagnosis not present

## 2019-01-28 DIAGNOSIS — K651 Peritoneal abscess: Secondary | ICD-10-CM | POA: Diagnosis not present

## 2019-01-28 DIAGNOSIS — D649 Anemia, unspecified: Secondary | ICD-10-CM | POA: Diagnosis not present

## 2019-01-29 DIAGNOSIS — I129 Hypertensive chronic kidney disease with stage 1 through stage 4 chronic kidney disease, or unspecified chronic kidney disease: Secondary | ICD-10-CM | POA: Diagnosis not present

## 2019-01-29 DIAGNOSIS — I2510 Atherosclerotic heart disease of native coronary artery without angina pectoris: Secondary | ICD-10-CM | POA: Diagnosis not present

## 2019-01-29 DIAGNOSIS — E039 Hypothyroidism, unspecified: Secondary | ICD-10-CM | POA: Diagnosis not present

## 2019-01-29 DIAGNOSIS — F329 Major depressive disorder, single episode, unspecified: Secondary | ICD-10-CM | POA: Diagnosis not present

## 2019-01-29 DIAGNOSIS — M4800 Spinal stenosis, site unspecified: Secondary | ICD-10-CM | POA: Diagnosis not present

## 2019-01-29 DIAGNOSIS — J449 Chronic obstructive pulmonary disease, unspecified: Secondary | ICD-10-CM | POA: Diagnosis not present

## 2019-01-29 DIAGNOSIS — S42202D Unspecified fracture of upper end of left humerus, subsequent encounter for fracture with routine healing: Secondary | ICD-10-CM | POA: Diagnosis not present

## 2019-01-29 DIAGNOSIS — N189 Chronic kidney disease, unspecified: Secondary | ICD-10-CM | POA: Diagnosis not present

## 2019-01-29 DIAGNOSIS — F419 Anxiety disorder, unspecified: Secondary | ICD-10-CM | POA: Diagnosis not present

## 2019-01-31 DIAGNOSIS — I2510 Atherosclerotic heart disease of native coronary artery without angina pectoris: Secondary | ICD-10-CM | POA: Diagnosis not present

## 2019-01-31 DIAGNOSIS — N189 Chronic kidney disease, unspecified: Secondary | ICD-10-CM | POA: Diagnosis not present

## 2019-01-31 DIAGNOSIS — S42202D Unspecified fracture of upper end of left humerus, subsequent encounter for fracture with routine healing: Secondary | ICD-10-CM | POA: Diagnosis not present

## 2019-01-31 DIAGNOSIS — E039 Hypothyroidism, unspecified: Secondary | ICD-10-CM | POA: Diagnosis not present

## 2019-01-31 DIAGNOSIS — J449 Chronic obstructive pulmonary disease, unspecified: Secondary | ICD-10-CM | POA: Diagnosis not present

## 2019-01-31 DIAGNOSIS — F329 Major depressive disorder, single episode, unspecified: Secondary | ICD-10-CM | POA: Diagnosis not present

## 2019-01-31 DIAGNOSIS — M4800 Spinal stenosis, site unspecified: Secondary | ICD-10-CM | POA: Diagnosis not present

## 2019-01-31 DIAGNOSIS — I129 Hypertensive chronic kidney disease with stage 1 through stage 4 chronic kidney disease, or unspecified chronic kidney disease: Secondary | ICD-10-CM | POA: Diagnosis not present

## 2019-01-31 DIAGNOSIS — F419 Anxiety disorder, unspecified: Secondary | ICD-10-CM | POA: Diagnosis not present

## 2019-02-04 DIAGNOSIS — Z6834 Body mass index (BMI) 34.0-34.9, adult: Secondary | ICD-10-CM | POA: Diagnosis not present

## 2019-02-04 DIAGNOSIS — R069 Unspecified abnormalities of breathing: Secondary | ICD-10-CM | POA: Diagnosis not present

## 2019-02-04 DIAGNOSIS — R7989 Other specified abnormal findings of blood chemistry: Secondary | ICD-10-CM | POA: Diagnosis not present

## 2019-02-05 DIAGNOSIS — I2510 Atherosclerotic heart disease of native coronary artery without angina pectoris: Secondary | ICD-10-CM | POA: Diagnosis not present

## 2019-02-05 DIAGNOSIS — I129 Hypertensive chronic kidney disease with stage 1 through stage 4 chronic kidney disease, or unspecified chronic kidney disease: Secondary | ICD-10-CM | POA: Diagnosis not present

## 2019-02-05 DIAGNOSIS — E039 Hypothyroidism, unspecified: Secondary | ICD-10-CM | POA: Diagnosis not present

## 2019-02-05 DIAGNOSIS — F419 Anxiety disorder, unspecified: Secondary | ICD-10-CM | POA: Diagnosis not present

## 2019-02-05 DIAGNOSIS — J449 Chronic obstructive pulmonary disease, unspecified: Secondary | ICD-10-CM | POA: Diagnosis not present

## 2019-02-05 DIAGNOSIS — N189 Chronic kidney disease, unspecified: Secondary | ICD-10-CM | POA: Diagnosis not present

## 2019-02-05 DIAGNOSIS — S42202D Unspecified fracture of upper end of left humerus, subsequent encounter for fracture with routine healing: Secondary | ICD-10-CM | POA: Diagnosis not present

## 2019-02-05 DIAGNOSIS — M4800 Spinal stenosis, site unspecified: Secondary | ICD-10-CM | POA: Diagnosis not present

## 2019-02-05 DIAGNOSIS — F329 Major depressive disorder, single episode, unspecified: Secondary | ICD-10-CM | POA: Diagnosis not present

## 2019-02-06 DIAGNOSIS — Z09 Encounter for follow-up examination after completed treatment for conditions other than malignant neoplasm: Secondary | ICD-10-CM | POA: Diagnosis not present

## 2019-02-07 DIAGNOSIS — F419 Anxiety disorder, unspecified: Secondary | ICD-10-CM | POA: Diagnosis not present

## 2019-02-07 DIAGNOSIS — I129 Hypertensive chronic kidney disease with stage 1 through stage 4 chronic kidney disease, or unspecified chronic kidney disease: Secondary | ICD-10-CM | POA: Diagnosis not present

## 2019-02-07 DIAGNOSIS — R7989 Other specified abnormal findings of blood chemistry: Secondary | ICD-10-CM | POA: Diagnosis not present

## 2019-02-07 DIAGNOSIS — S42202D Unspecified fracture of upper end of left humerus, subsequent encounter for fracture with routine healing: Secondary | ICD-10-CM | POA: Diagnosis not present

## 2019-02-07 DIAGNOSIS — J449 Chronic obstructive pulmonary disease, unspecified: Secondary | ICD-10-CM | POA: Diagnosis not present

## 2019-02-07 DIAGNOSIS — M4800 Spinal stenosis, site unspecified: Secondary | ICD-10-CM | POA: Diagnosis not present

## 2019-02-07 DIAGNOSIS — N189 Chronic kidney disease, unspecified: Secondary | ICD-10-CM | POA: Diagnosis not present

## 2019-02-07 DIAGNOSIS — F329 Major depressive disorder, single episode, unspecified: Secondary | ICD-10-CM | POA: Diagnosis not present

## 2019-02-07 DIAGNOSIS — I2510 Atherosclerotic heart disease of native coronary artery without angina pectoris: Secondary | ICD-10-CM | POA: Diagnosis not present

## 2019-02-07 DIAGNOSIS — E039 Hypothyroidism, unspecified: Secondary | ICD-10-CM | POA: Diagnosis not present

## 2019-02-08 DIAGNOSIS — H02005 Unspecified entropion of left lower eyelid: Secondary | ICD-10-CM | POA: Diagnosis not present

## 2019-02-08 DIAGNOSIS — H02002 Unspecified entropion of right lower eyelid: Secondary | ICD-10-CM | POA: Diagnosis not present

## 2019-02-11 DIAGNOSIS — J989 Respiratory disorder, unspecified: Secondary | ICD-10-CM | POA: Diagnosis not present

## 2019-02-11 DIAGNOSIS — H5010 Unspecified exotropia: Secondary | ICD-10-CM | POA: Diagnosis not present

## 2019-02-11 DIAGNOSIS — I679 Cerebrovascular disease, unspecified: Secondary | ICD-10-CM | POA: Diagnosis not present

## 2019-02-11 DIAGNOSIS — R4182 Altered mental status, unspecified: Secondary | ICD-10-CM | POA: Diagnosis not present

## 2019-02-11 DIAGNOSIS — Z6833 Body mass index (BMI) 33.0-33.9, adult: Secondary | ICD-10-CM | POA: Diagnosis not present

## 2019-02-13 DIAGNOSIS — I2510 Atherosclerotic heart disease of native coronary artery without angina pectoris: Secondary | ICD-10-CM | POA: Diagnosis not present

## 2019-02-13 DIAGNOSIS — N189 Chronic kidney disease, unspecified: Secondary | ICD-10-CM | POA: Diagnosis not present

## 2019-02-13 DIAGNOSIS — F419 Anxiety disorder, unspecified: Secondary | ICD-10-CM | POA: Diagnosis not present

## 2019-02-13 DIAGNOSIS — K5720 Diverticulitis of large intestine with perforation and abscess without bleeding: Secondary | ICD-10-CM | POA: Diagnosis not present

## 2019-02-13 DIAGNOSIS — F329 Major depressive disorder, single episode, unspecified: Secondary | ICD-10-CM | POA: Diagnosis not present

## 2019-02-13 DIAGNOSIS — M4800 Spinal stenosis, site unspecified: Secondary | ICD-10-CM | POA: Diagnosis not present

## 2019-02-13 DIAGNOSIS — J449 Chronic obstructive pulmonary disease, unspecified: Secondary | ICD-10-CM | POA: Diagnosis not present

## 2019-02-13 DIAGNOSIS — E039 Hypothyroidism, unspecified: Secondary | ICD-10-CM | POA: Diagnosis not present

## 2019-02-13 DIAGNOSIS — I129 Hypertensive chronic kidney disease with stage 1 through stage 4 chronic kidney disease, or unspecified chronic kidney disease: Secondary | ICD-10-CM | POA: Diagnosis not present

## 2019-02-14 DIAGNOSIS — F329 Major depressive disorder, single episode, unspecified: Secondary | ICD-10-CM | POA: Diagnosis not present

## 2019-02-14 DIAGNOSIS — E039 Hypothyroidism, unspecified: Secondary | ICD-10-CM | POA: Diagnosis not present

## 2019-02-14 DIAGNOSIS — F419 Anxiety disorder, unspecified: Secondary | ICD-10-CM | POA: Diagnosis not present

## 2019-02-14 DIAGNOSIS — N189 Chronic kidney disease, unspecified: Secondary | ICD-10-CM | POA: Diagnosis not present

## 2019-02-14 DIAGNOSIS — M4800 Spinal stenosis, site unspecified: Secondary | ICD-10-CM | POA: Diagnosis not present

## 2019-02-14 DIAGNOSIS — J449 Chronic obstructive pulmonary disease, unspecified: Secondary | ICD-10-CM | POA: Diagnosis not present

## 2019-02-14 DIAGNOSIS — I2510 Atherosclerotic heart disease of native coronary artery without angina pectoris: Secondary | ICD-10-CM | POA: Diagnosis not present

## 2019-02-14 DIAGNOSIS — K5720 Diverticulitis of large intestine with perforation and abscess without bleeding: Secondary | ICD-10-CM | POA: Diagnosis not present

## 2019-02-14 DIAGNOSIS — I129 Hypertensive chronic kidney disease with stage 1 through stage 4 chronic kidney disease, or unspecified chronic kidney disease: Secondary | ICD-10-CM | POA: Diagnosis not present

## 2019-02-15 DIAGNOSIS — M4800 Spinal stenosis, site unspecified: Secondary | ICD-10-CM | POA: Diagnosis not present

## 2019-02-15 DIAGNOSIS — F419 Anxiety disorder, unspecified: Secondary | ICD-10-CM | POA: Diagnosis not present

## 2019-02-15 DIAGNOSIS — Z09 Encounter for follow-up examination after completed treatment for conditions other than malignant neoplasm: Secondary | ICD-10-CM | POA: Diagnosis not present

## 2019-02-15 DIAGNOSIS — I2510 Atherosclerotic heart disease of native coronary artery without angina pectoris: Secondary | ICD-10-CM | POA: Diagnosis not present

## 2019-02-15 DIAGNOSIS — N189 Chronic kidney disease, unspecified: Secondary | ICD-10-CM | POA: Diagnosis not present

## 2019-02-15 DIAGNOSIS — F329 Major depressive disorder, single episode, unspecified: Secondary | ICD-10-CM | POA: Diagnosis not present

## 2019-02-15 DIAGNOSIS — I129 Hypertensive chronic kidney disease with stage 1 through stage 4 chronic kidney disease, or unspecified chronic kidney disease: Secondary | ICD-10-CM | POA: Diagnosis not present

## 2019-02-15 DIAGNOSIS — J449 Chronic obstructive pulmonary disease, unspecified: Secondary | ICD-10-CM | POA: Diagnosis not present

## 2019-02-15 DIAGNOSIS — E039 Hypothyroidism, unspecified: Secondary | ICD-10-CM | POA: Diagnosis not present

## 2019-02-15 DIAGNOSIS — K5720 Diverticulitis of large intestine with perforation and abscess without bleeding: Secondary | ICD-10-CM | POA: Diagnosis not present

## 2019-02-19 DIAGNOSIS — I129 Hypertensive chronic kidney disease with stage 1 through stage 4 chronic kidney disease, or unspecified chronic kidney disease: Secondary | ICD-10-CM | POA: Diagnosis not present

## 2019-02-19 DIAGNOSIS — F329 Major depressive disorder, single episode, unspecified: Secondary | ICD-10-CM | POA: Diagnosis not present

## 2019-02-19 DIAGNOSIS — K5720 Diverticulitis of large intestine with perforation and abscess without bleeding: Secondary | ICD-10-CM | POA: Diagnosis not present

## 2019-02-19 DIAGNOSIS — J449 Chronic obstructive pulmonary disease, unspecified: Secondary | ICD-10-CM | POA: Diagnosis not present

## 2019-02-19 DIAGNOSIS — E039 Hypothyroidism, unspecified: Secondary | ICD-10-CM | POA: Diagnosis not present

## 2019-02-19 DIAGNOSIS — I2510 Atherosclerotic heart disease of native coronary artery without angina pectoris: Secondary | ICD-10-CM | POA: Diagnosis not present

## 2019-02-19 DIAGNOSIS — N189 Chronic kidney disease, unspecified: Secondary | ICD-10-CM | POA: Diagnosis not present

## 2019-02-19 DIAGNOSIS — M4800 Spinal stenosis, site unspecified: Secondary | ICD-10-CM | POA: Diagnosis not present

## 2019-02-19 DIAGNOSIS — F419 Anxiety disorder, unspecified: Secondary | ICD-10-CM | POA: Diagnosis not present

## 2019-02-20 DIAGNOSIS — H02005 Unspecified entropion of left lower eyelid: Secondary | ICD-10-CM | POA: Diagnosis not present

## 2019-02-20 DIAGNOSIS — H02032 Senile entropion of right lower eyelid: Secondary | ICD-10-CM | POA: Diagnosis not present

## 2019-02-20 DIAGNOSIS — H02035 Senile entropion of left lower eyelid: Secondary | ICD-10-CM | POA: Diagnosis not present

## 2019-02-20 DIAGNOSIS — H02003 Unspecified entropion of right eye, unspecified eyelid: Secondary | ICD-10-CM | POA: Diagnosis not present

## 2019-02-20 DIAGNOSIS — H02002 Unspecified entropion of right lower eyelid: Secondary | ICD-10-CM | POA: Diagnosis not present

## 2019-02-22 DIAGNOSIS — F419 Anxiety disorder, unspecified: Secondary | ICD-10-CM | POA: Diagnosis not present

## 2019-02-22 DIAGNOSIS — I129 Hypertensive chronic kidney disease with stage 1 through stage 4 chronic kidney disease, or unspecified chronic kidney disease: Secondary | ICD-10-CM | POA: Diagnosis not present

## 2019-02-22 DIAGNOSIS — K5720 Diverticulitis of large intestine with perforation and abscess without bleeding: Secondary | ICD-10-CM | POA: Diagnosis not present

## 2019-02-22 DIAGNOSIS — I2510 Atherosclerotic heart disease of native coronary artery without angina pectoris: Secondary | ICD-10-CM | POA: Diagnosis not present

## 2019-02-22 DIAGNOSIS — M4800 Spinal stenosis, site unspecified: Secondary | ICD-10-CM | POA: Diagnosis not present

## 2019-02-22 DIAGNOSIS — E039 Hypothyroidism, unspecified: Secondary | ICD-10-CM | POA: Diagnosis not present

## 2019-02-22 DIAGNOSIS — J449 Chronic obstructive pulmonary disease, unspecified: Secondary | ICD-10-CM | POA: Diagnosis not present

## 2019-02-22 DIAGNOSIS — F329 Major depressive disorder, single episode, unspecified: Secondary | ICD-10-CM | POA: Diagnosis not present

## 2019-02-22 DIAGNOSIS — N189 Chronic kidney disease, unspecified: Secondary | ICD-10-CM | POA: Diagnosis not present

## 2019-03-01 DIAGNOSIS — M4800 Spinal stenosis, site unspecified: Secondary | ICD-10-CM | POA: Diagnosis not present

## 2019-03-01 DIAGNOSIS — F329 Major depressive disorder, single episode, unspecified: Secondary | ICD-10-CM | POA: Diagnosis not present

## 2019-03-01 DIAGNOSIS — E039 Hypothyroidism, unspecified: Secondary | ICD-10-CM | POA: Diagnosis not present

## 2019-03-01 DIAGNOSIS — I129 Hypertensive chronic kidney disease with stage 1 through stage 4 chronic kidney disease, or unspecified chronic kidney disease: Secondary | ICD-10-CM | POA: Diagnosis not present

## 2019-03-01 DIAGNOSIS — F419 Anxiety disorder, unspecified: Secondary | ICD-10-CM | POA: Diagnosis not present

## 2019-03-01 DIAGNOSIS — I2510 Atherosclerotic heart disease of native coronary artery without angina pectoris: Secondary | ICD-10-CM | POA: Diagnosis not present

## 2019-03-01 DIAGNOSIS — Z09 Encounter for follow-up examination after completed treatment for conditions other than malignant neoplasm: Secondary | ICD-10-CM | POA: Diagnosis not present

## 2019-03-01 DIAGNOSIS — K5720 Diverticulitis of large intestine with perforation and abscess without bleeding: Secondary | ICD-10-CM | POA: Diagnosis not present

## 2019-03-01 DIAGNOSIS — J449 Chronic obstructive pulmonary disease, unspecified: Secondary | ICD-10-CM | POA: Diagnosis not present

## 2019-03-01 DIAGNOSIS — N189 Chronic kidney disease, unspecified: Secondary | ICD-10-CM | POA: Diagnosis not present

## 2019-03-07 DIAGNOSIS — F419 Anxiety disorder, unspecified: Secondary | ICD-10-CM | POA: Diagnosis not present

## 2019-03-07 DIAGNOSIS — N189 Chronic kidney disease, unspecified: Secondary | ICD-10-CM | POA: Diagnosis not present

## 2019-03-07 DIAGNOSIS — M4800 Spinal stenosis, site unspecified: Secondary | ICD-10-CM | POA: Diagnosis not present

## 2019-03-07 DIAGNOSIS — E039 Hypothyroidism, unspecified: Secondary | ICD-10-CM | POA: Diagnosis not present

## 2019-03-07 DIAGNOSIS — I129 Hypertensive chronic kidney disease with stage 1 through stage 4 chronic kidney disease, or unspecified chronic kidney disease: Secondary | ICD-10-CM | POA: Diagnosis not present

## 2019-03-07 DIAGNOSIS — K5720 Diverticulitis of large intestine with perforation and abscess without bleeding: Secondary | ICD-10-CM | POA: Diagnosis not present

## 2019-03-07 DIAGNOSIS — F329 Major depressive disorder, single episode, unspecified: Secondary | ICD-10-CM | POA: Diagnosis not present

## 2019-03-07 DIAGNOSIS — J449 Chronic obstructive pulmonary disease, unspecified: Secondary | ICD-10-CM | POA: Diagnosis not present

## 2019-03-07 DIAGNOSIS — I2510 Atherosclerotic heart disease of native coronary artery without angina pectoris: Secondary | ICD-10-CM | POA: Diagnosis not present

## 2019-03-12 DIAGNOSIS — F329 Major depressive disorder, single episode, unspecified: Secondary | ICD-10-CM | POA: Diagnosis not present

## 2019-03-12 DIAGNOSIS — N189 Chronic kidney disease, unspecified: Secondary | ICD-10-CM | POA: Diagnosis not present

## 2019-03-12 DIAGNOSIS — M4800 Spinal stenosis, site unspecified: Secondary | ICD-10-CM | POA: Diagnosis not present

## 2019-03-12 DIAGNOSIS — Z09 Encounter for follow-up examination after completed treatment for conditions other than malignant neoplasm: Secondary | ICD-10-CM | POA: Diagnosis not present

## 2019-03-12 DIAGNOSIS — I129 Hypertensive chronic kidney disease with stage 1 through stage 4 chronic kidney disease, or unspecified chronic kidney disease: Secondary | ICD-10-CM | POA: Diagnosis not present

## 2019-03-12 DIAGNOSIS — E039 Hypothyroidism, unspecified: Secondary | ICD-10-CM | POA: Diagnosis not present

## 2019-03-12 DIAGNOSIS — J449 Chronic obstructive pulmonary disease, unspecified: Secondary | ICD-10-CM | POA: Diagnosis not present

## 2019-03-12 DIAGNOSIS — F419 Anxiety disorder, unspecified: Secondary | ICD-10-CM | POA: Diagnosis not present

## 2019-03-12 DIAGNOSIS — K5720 Diverticulitis of large intestine with perforation and abscess without bleeding: Secondary | ICD-10-CM | POA: Diagnosis not present

## 2019-03-12 DIAGNOSIS — I2510 Atherosclerotic heart disease of native coronary artery without angina pectoris: Secondary | ICD-10-CM | POA: Diagnosis not present

## 2019-03-13 ENCOUNTER — Ambulatory Visit (INDEPENDENT_AMBULATORY_CARE_PROVIDER_SITE_OTHER): Payer: Medicare HMO | Admitting: Orthopedic Surgery

## 2019-03-13 DIAGNOSIS — Z96612 Presence of left artificial shoulder joint: Secondary | ICD-10-CM

## 2019-03-15 ENCOUNTER — Encounter: Payer: Self-pay | Admitting: Orthopedic Surgery

## 2019-03-15 DIAGNOSIS — Z6834 Body mass index (BMI) 34.0-34.9, adult: Secondary | ICD-10-CM | POA: Diagnosis not present

## 2019-03-15 DIAGNOSIS — I5030 Unspecified diastolic (congestive) heart failure: Secondary | ICD-10-CM | POA: Diagnosis not present

## 2019-03-15 DIAGNOSIS — D509 Iron deficiency anemia, unspecified: Secondary | ICD-10-CM | POA: Diagnosis not present

## 2019-03-15 DIAGNOSIS — J449 Chronic obstructive pulmonary disease, unspecified: Secondary | ICD-10-CM | POA: Diagnosis not present

## 2019-03-15 DIAGNOSIS — Z1331 Encounter for screening for depression: Secondary | ICD-10-CM | POA: Diagnosis not present

## 2019-03-15 DIAGNOSIS — Z139 Encounter for screening, unspecified: Secondary | ICD-10-CM | POA: Diagnosis not present
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
## 2019-03-18 ENCOUNTER — Telehealth: Payer: Self-pay | Admitting: Orthopedic Surgery

## 2019-03-18 NOTE — Telephone Encounter (Signed)
Patient's daughter called. Would like to talk to Dr.Dean about her mom's OT. Her call back number is [phone]

## 2019-03-18 NOTE — Telephone Encounter (Signed)
IC s/w patients daughter Durward Fortes ok to continue with [facility] therapy 1 x a week for the next 6 weeks.  Patients daughter had complaints about current [facility] agency and was asking for a different therapist. I s/w Sonia Side about this and he said that he would try to get a different therapist out to their home but due to where they live may or may not be able to accommodate.  If not other option is outpatient therapy. They also are already doing HEP

## 2019-03-20 DIAGNOSIS — K5720 Diverticulitis of large intestine with perforation and abscess without bleeding: Secondary | ICD-10-CM | POA: Diagnosis not present

## 2019-03-20 DIAGNOSIS — F419 Anxiety disorder, unspecified: Secondary | ICD-10-CM | POA: Diagnosis not present

## 2019-03-20 DIAGNOSIS — J449 Chronic obstructive pulmonary disease, unspecified: Secondary | ICD-10-CM | POA: Diagnosis not present

## 2019-03-20 DIAGNOSIS — I2510 Atherosclerotic heart disease of native coronary artery without angina pectoris: Secondary | ICD-10-CM | POA: Diagnosis not present

## 2019-03-20 DIAGNOSIS — M4800 Spinal stenosis, site unspecified: Secondary | ICD-10-CM | POA: Diagnosis not present

## 2019-03-20 DIAGNOSIS — I129 Hypertensive chronic kidney disease with stage 1 through stage 4 chronic kidney disease, or unspecified chronic kidney disease: Secondary | ICD-10-CM | POA: Diagnosis not present

## 2019-03-20 DIAGNOSIS — E039 Hypothyroidism, unspecified: Secondary | ICD-10-CM | POA: Diagnosis not present

## 2019-03-20 DIAGNOSIS — F329 Major depressive disorder, single episode, unspecified: Secondary | ICD-10-CM | POA: Diagnosis not present

## 2019-03-20 DIAGNOSIS — N189 Chronic kidney disease, unspecified: Secondary | ICD-10-CM | POA: Diagnosis not present

## 2019-03-27 DIAGNOSIS — I129 Hypertensive chronic kidney disease with stage 1 through stage 4 chronic kidney disease, or unspecified chronic kidney disease: Secondary | ICD-10-CM | POA: Diagnosis not present

## 2019-03-27 DIAGNOSIS — E039 Hypothyroidism, unspecified: Secondary | ICD-10-CM | POA: Diagnosis not present

## 2019-03-27 DIAGNOSIS — F419 Anxiety disorder, unspecified: Secondary | ICD-10-CM | POA: Diagnosis not present

## 2019-03-27 DIAGNOSIS — N189 Chronic kidney disease, unspecified: Secondary | ICD-10-CM | POA: Diagnosis not present

## 2019-03-27 DIAGNOSIS — J449 Chronic obstructive pulmonary disease, unspecified: Secondary | ICD-10-CM | POA: Diagnosis not present

## 2019-03-27 DIAGNOSIS — I2510 Atherosclerotic heart disease of native coronary artery without angina pectoris: Secondary | ICD-10-CM | POA: Diagnosis not present

## 2019-03-27 DIAGNOSIS — F329 Major depressive disorder, single episode, unspecified: Secondary | ICD-10-CM | POA: Diagnosis not present

## 2019-03-27 DIAGNOSIS — K5720 Diverticulitis of large intestine with perforation and abscess without bleeding: Secondary | ICD-10-CM | POA: Diagnosis not present

## 2019-03-27 DIAGNOSIS — M4800 Spinal stenosis, site unspecified: Secondary | ICD-10-CM | POA: Diagnosis not present

## 2019-04-03 DIAGNOSIS — I129 Hypertensive chronic kidney disease with stage 1 through stage 4 chronic kidney disease, or unspecified chronic kidney disease: Secondary | ICD-10-CM | POA: Diagnosis not present

## 2019-04-03 DIAGNOSIS — F329 Major depressive disorder, single episode, unspecified: Secondary | ICD-10-CM | POA: Diagnosis not present

## 2019-04-03 DIAGNOSIS — K5720 Diverticulitis of large intestine with perforation and abscess without bleeding: Secondary | ICD-10-CM | POA: Diagnosis not present

## 2019-04-03 DIAGNOSIS — E039 Hypothyroidism, unspecified: Secondary | ICD-10-CM | POA: Diagnosis not present

## 2019-04-03 DIAGNOSIS — I2510 Atherosclerotic heart disease of native coronary artery without angina pectoris: Secondary | ICD-10-CM | POA: Diagnosis not present

## 2019-04-03 DIAGNOSIS — M4800 Spinal stenosis, site unspecified: Secondary | ICD-10-CM | POA: Diagnosis not present

## 2019-04-03 DIAGNOSIS — F419 Anxiety disorder, unspecified: Secondary | ICD-10-CM | POA: Diagnosis not present

## 2019-04-03 DIAGNOSIS — N189 Chronic kidney disease, unspecified: Secondary | ICD-10-CM | POA: Diagnosis not present

## 2019-04-03 DIAGNOSIS — J449 Chronic obstructive pulmonary disease, unspecified: Secondary | ICD-10-CM | POA: Diagnosis not present

## 2019-04-05 DIAGNOSIS — Z23 Encounter for immunization: Secondary | ICD-10-CM | POA: Diagnosis not present

## 2019-04-05 DIAGNOSIS — D649 Anemia, unspecified: Secondary | ICD-10-CM | POA: Diagnosis not present

## 2019-04-05 DIAGNOSIS — D72829 Elevated white blood cell count, unspecified: Secondary | ICD-10-CM | POA: Diagnosis not present

## 2019-04-05 DIAGNOSIS — J449 Chronic obstructive pulmonary disease, unspecified: Secondary | ICD-10-CM | POA: Diagnosis not present

## 2019-04-09 DIAGNOSIS — Z09 Encounter for follow-up examination after completed treatment for conditions other than malignant neoplasm: Secondary | ICD-10-CM | POA: Diagnosis not present

## 2019-04-10 DIAGNOSIS — J449 Chronic obstructive pulmonary disease, unspecified: Secondary | ICD-10-CM | POA: Diagnosis not present

## 2019-04-10 DIAGNOSIS — N189 Chronic kidney disease, unspecified: Secondary | ICD-10-CM | POA: Diagnosis not present

## 2019-04-10 DIAGNOSIS — K5720 Diverticulitis of large intestine with perforation and abscess without bleeding: Secondary | ICD-10-CM | POA: Diagnosis not present

## 2019-04-10 DIAGNOSIS — I2510 Atherosclerotic heart disease of native coronary artery without angina pectoris: Secondary | ICD-10-CM | POA: Diagnosis not present

## 2019-04-10 DIAGNOSIS — M4800 Spinal stenosis, site unspecified: Secondary | ICD-10-CM | POA: Diagnosis not present

## 2019-04-10 DIAGNOSIS — F419 Anxiety disorder, unspecified: Secondary | ICD-10-CM | POA: Diagnosis not present

## 2019-04-10 DIAGNOSIS — E039 Hypothyroidism, unspecified: Secondary | ICD-10-CM | POA: Diagnosis not present

## 2019-04-10 DIAGNOSIS — F329 Major depressive disorder, single episode, unspecified: Secondary | ICD-10-CM | POA: Diagnosis not present

## 2019-04-10 DIAGNOSIS — I129 Hypertensive chronic kidney disease with stage 1 through stage 4 chronic kidney disease, or unspecified chronic kidney disease: Secondary | ICD-10-CM | POA: Diagnosis not present

## 2019-04-25 DIAGNOSIS — Z933 Colostomy status: Secondary | ICD-10-CM | POA: Diagnosis not present

## 2019-04-25 DIAGNOSIS — K5792 Diverticulitis of intestine, part unspecified, without perforation or abscess without bleeding: Secondary | ICD-10-CM | POA: Diagnosis not present

## 2019-04-25 DIAGNOSIS — I10 Essential (primary) hypertension: Secondary | ICD-10-CM | POA: Diagnosis not present

## 2019-04-25 DIAGNOSIS — K5730 Diverticulosis of large intestine without perforation or abscess without bleeding: Secondary | ICD-10-CM | POA: Diagnosis not present

## 2019-04-25 DIAGNOSIS — Z86010 Personal history of colonic polyps: Secondary | ICD-10-CM | POA: Diagnosis not present

## 2019-04-25 DIAGNOSIS — Z09 Encounter for follow-up examination after completed treatment for conditions other than malignant neoplasm: Secondary | ICD-10-CM | POA: Diagnosis not present

## 2019-04-30 DIAGNOSIS — Z933 Colostomy status: Secondary | ICD-10-CM | POA: Diagnosis not present

## 2019-05-13 DIAGNOSIS — E785 Hyperlipidemia, unspecified: Secondary | ICD-10-CM | POA: Diagnosis not present

## 2019-05-13 DIAGNOSIS — Z862 Personal history of diseases of the blood and blood-forming organs and certain disorders involving the immune mechanism: Secondary | ICD-10-CM | POA: Diagnosis not present

## 2019-05-13 DIAGNOSIS — Z5331 Laparoscopic surgical procedure converted to open procedure: Secondary | ICD-10-CM | POA: Diagnosis not present

## 2019-05-13 DIAGNOSIS — Z8719 Personal history of other diseases of the digestive system: Secondary | ICD-10-CM | POA: Diagnosis not present

## 2019-05-13 DIAGNOSIS — K660 Peritoneal adhesions (postprocedural) (postinfection): Secondary | ICD-10-CM | POA: Diagnosis not present

## 2019-05-13 DIAGNOSIS — K9401 Colostomy hemorrhage: Secondary | ICD-10-CM | POA: Diagnosis not present

## 2019-05-13 DIAGNOSIS — Z433 Encounter for attention to colostomy: Secondary | ICD-10-CM | POA: Diagnosis not present

## 2019-05-13 DIAGNOSIS — Z9049 Acquired absence of other specified parts of digestive tract: Secondary | ICD-10-CM | POA: Diagnosis not present

## 2019-05-13 DIAGNOSIS — J9601 Acute respiratory failure with hypoxia: Secondary | ICD-10-CM | POA: Diagnosis not present

## 2019-05-13 DIAGNOSIS — R918 Other nonspecific abnormal finding of lung field: Secondary | ICD-10-CM | POA: Diagnosis not present

## 2019-05-13 DIAGNOSIS — Z933 Colostomy status: Secondary | ICD-10-CM | POA: Diagnosis not present

## 2019-05-13 DIAGNOSIS — J45909 Unspecified asthma, uncomplicated: Secondary | ICD-10-CM | POA: Diagnosis not present

## 2019-05-13 DIAGNOSIS — G8918 Other acute postprocedural pain: Secondary | ICD-10-CM | POA: Diagnosis not present

## 2019-05-13 DIAGNOSIS — E039 Hypothyroidism, unspecified: Secondary | ICD-10-CM | POA: Diagnosis not present

## 2019-05-13 DIAGNOSIS — K5792 Diverticulitis of intestine, part unspecified, without perforation or abscess without bleeding: Secondary | ICD-10-CM

## 2019-05-13 HISTORY — DX: Diverticulitis of intestine, part unspecified, without perforation or abscess without bleeding: K57.92

## 2019-05-14 HISTORY — PX: BELPHAROPTOSIS REPAIR: SHX369

## 2019-05-24 DIAGNOSIS — Z09 Encounter for follow-up examination after completed treatment for conditions other than malignant neoplasm: Secondary | ICD-10-CM | POA: Diagnosis not present

## 2019-05-27 DIAGNOSIS — N183 Chronic kidney disease, stage 3 unspecified: Secondary | ICD-10-CM | POA: Diagnosis not present

## 2019-05-27 DIAGNOSIS — I5030 Unspecified diastolic (congestive) heart failure: Secondary | ICD-10-CM | POA: Diagnosis not present

## 2019-05-27 DIAGNOSIS — Z6834 Body mass index (BMI) 34.0-34.9, adult: Secondary | ICD-10-CM | POA: Diagnosis not present

## 2019-05-27 DIAGNOSIS — Z7689 Persons encountering health services in other specified circumstances: Secondary | ICD-10-CM | POA: Diagnosis not present

## 2019-05-31 DIAGNOSIS — Z09 Encounter for follow-up examination after completed treatment for conditions other than malignant neoplasm: Secondary | ICD-10-CM | POA: Diagnosis not present

## 2019-06-03 DIAGNOSIS — B373 Candidiasis of vulva and vagina: Secondary | ICD-10-CM | POA: Diagnosis not present

## 2019-06-03 DIAGNOSIS — N952 Postmenopausal atrophic vaginitis: Secondary | ICD-10-CM | POA: Diagnosis not present

## 2019-06-03 DIAGNOSIS — Z79899 Other long term (current) drug therapy: Secondary | ICD-10-CM | POA: Diagnosis not present

## 2019-06-03 DIAGNOSIS — R339 Retention of urine, unspecified: Secondary | ICD-10-CM | POA: Diagnosis not present

## 2019-06-05 ENCOUNTER — Ambulatory Visit (INDEPENDENT_AMBULATORY_CARE_PROVIDER_SITE_OTHER): Payer: Medicare HMO | Admitting: Orthopedic Surgery

## 2019-06-05 ENCOUNTER — Ambulatory Visit: Payer: Self-pay

## 2019-06-05 ENCOUNTER — Ambulatory Visit: Payer: Medicare HMO | Admitting: Orthopaedic Surgery

## 2019-06-05 ENCOUNTER — Telehealth: Payer: Self-pay | Admitting: Orthopedic Surgery

## 2019-06-05 ENCOUNTER — Other Ambulatory Visit: Payer: Self-pay

## 2019-06-05 ENCOUNTER — Telehealth: Payer: Self-pay

## 2019-06-05 DIAGNOSIS — M545 Low back pain, unspecified: Secondary | ICD-10-CM

## 2019-06-05 DIAGNOSIS — G8929 Other chronic pain: Secondary | ICD-10-CM

## 2019-06-05 DIAGNOSIS — M25552 Pain in left hip: Secondary | ICD-10-CM | POA: Diagnosis not present

## 2019-06-05 DIAGNOSIS — M79605 Pain in left leg: Secondary | ICD-10-CM | POA: Diagnosis not present

## 2019-06-05 MED ORDER — METHOCARBAMOL 500 MG PO TABS: 500.0000 mg | ORAL_TABLET | Freq: Two times a day (BID) | ORAL | 0 refills | Status: DC | PRN

## 2019-06-05 MED ORDER — TRAMADOL HCL 50 MG PO TABS: 50.0000 mg | ORAL_TABLET | Freq: Two times a day (BID) | ORAL | 0 refills | Status: DC | PRN

## 2019-06-05 NOTE — Telephone Encounter (Signed)
Called and s/w daughter.  Informed her that she may be worked in to schedule but there are ~25 other patients this afternoon and she may have to wait 2-3 hours.  Patient understands.  She will bring her mother in for appt.

## 2019-06-05 NOTE — Telephone Encounter (Signed)
Please adivse. Thanks.

## 2019-06-05 NOTE — Telephone Encounter (Signed)
Call entered in error

## 2019-06-05 NOTE — Telephone Encounter (Signed)
Patient's daughter Silva Bandy called stating that patient has been having severe pain in her left leg since last Thursday. Hurts when patient gets up to walk or with any movement.  Stated that patient has been taking aleve and using a gel, but they are not helping and her BP is going up.  No fall or no swelling in left leg.  Would like to know if patient can be worked in with Dr. Marlou Sa or another provider.  CB# [phone].  Please advise.

## 2019-06-08 ENCOUNTER — Encounter: Payer: Self-pay | Admitting: Orthopedic Surgery
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
## 2019-06-10 ENCOUNTER — Telehealth: Payer: Self-pay | Admitting: Orthopedic Surgery

## 2019-06-10 NOTE — Telephone Encounter (Signed)
Patient's daughter called. Her mother is in extreme pain but the place she was referred to for an MRI cannot get in her in until 1/16 due to the accommodations she needs.   They are requesting they be referred to another imaging facility.   Call back number: [phone]

## 2019-06-10 NOTE — Telephone Encounter (Signed)
Going to try to get her in at [facility]

## 2019-06-10 NOTE — Telephone Encounter (Signed)
See message. Can we get her in sooner anywhere else?

## 2019-06-11 NOTE — Telephone Encounter (Signed)
Still pending authorization, once authorization comes back with send to [facility]

## 2019-06-12 ENCOUNTER — Telehealth: Payer: Self-pay | Admitting: Orthopedic Surgery

## 2019-06-12 NOTE — Telephone Encounter (Signed)
Patient's daughter called. Her mother was sent to have an MRI but needs to be sent to a facility that caters to claustrophobic patients.   Call back number: [phone]

## 2019-06-12 NOTE — Telephone Encounter (Signed)
Can you please help with this since Gabriel Cirri is out of the office?

## 2019-06-13 NOTE — Telephone Encounter (Signed)
Spoke with separate representatives with patients insurance, was on phone for well over an hour.   Both procedures MRI pelvis and MRI lumbar have been authorized. However with change of location, that needs to be updated in authorization to stay valid and facility to schedule. These procedures were submitted online, unfortunately after speaking with insurance representative they will not be able to change the location until after January 1st. I do not has access to online portal.  Referral coordinator can go online beginning of next week to change location or call insurance, so these can be scheduled at [facility] with the open MRI.

## 2019-06-13 NOTE — Telephone Encounter (Signed)
Called [name] yesterday evening. Explained our referral coordinator was out of office.  Advised the MRI pelvis is still in pending auth.  The MRI lumbar spine was approved by insurance for [facility], however with the need of an open scanner we may need to change location on auth and approval.  After speaking with patients daughter I called [facility] they do not have an open scanner. I contacted [facility] and they do have an open scanner which will be better for patient due to be claustrophobic.  I will attempt to change locations on both authorizations.

## 2019-06-13 NOTE — Telephone Encounter (Signed)
IC s/w Phyllis at length.  I advised per Brynn's message below. She verbalized understanding.

## 2019-06-17 NOTE — Telephone Encounter (Signed)
Received message from [place] with [facility] stating pt daughter was rude and when tried to give her an appt daughter stated she will call back to schedule because she should have been scheduled last week, now she has to check with her brother to see if he can bring her.

## 2019-06-17 NOTE — Telephone Encounter (Signed)
Pt has been approved for both to [facility] [place] contacted [place] with [facility] and advised her

## 2019-06-17 NOTE — Telephone Encounter (Signed)
I called [facility] health sw representative and in order to change the facility I have to withdraw both approved cases that were approved at [facility] and resubmit, I am now resubmitting the case. Pending auth to [facility]

## 2019-06-18 ENCOUNTER — Telehealth: Payer: Self-pay | Admitting: Orthopedic Surgery

## 2019-06-18 NOTE — Telephone Encounter (Signed)
Please advise if you are willing to call with results. Thanks.

## 2019-06-18 NOTE — Telephone Encounter (Signed)
Spoke with patient's daughter phyllis concerning MRI. The MRI is scheduled for 06/22/2019. Phyllis asked if she can be called with the MRI results due to Dr Forbes Cellar schedule being to far out.  The number to contact Silva Bandy is [phone]

## 2019-06-18 NOTE — Telephone Encounter (Signed)
sure

## 2019-06-19 NOTE — Telephone Encounter (Signed)
Pt is scheduled for this Saturday a.m.

## 2019-06-19 NOTE — Telephone Encounter (Signed)
IC advised. They will let us know when scan has been completed.

## 2019-06-20 DIAGNOSIS — R339 Retention of urine, unspecified: Secondary | ICD-10-CM | POA: Diagnosis not present

## 2019-06-20 DIAGNOSIS — N952 Postmenopausal atrophic vaginitis: Secondary | ICD-10-CM | POA: Diagnosis not present

## 2019-06-22 ENCOUNTER — Other Ambulatory Visit: Payer: Self-pay

## 2019-06-22 ENCOUNTER — Ambulatory Visit (HOSPITAL_BASED_OUTPATIENT_CLINIC_OR_DEPARTMENT_OTHER)
Admission: RE | Admit: 2019-06-22 | Discharge: 2019-06-22 | Disposition: A | Discharge status: Home / Self Care | Payer: Medicare HMO | Source: Ambulatory Visit | Attending: Orthopedic Surgery | Admitting: Orthopedic Surgery

## 2019-06-22 DIAGNOSIS — G8929 Other chronic pain: Secondary | ICD-10-CM | POA: Diagnosis not present

## 2019-06-22 DIAGNOSIS — M79605 Pain in left leg: Secondary | ICD-10-CM

## 2019-06-22 DIAGNOSIS — M545 Low back pain, unspecified: Secondary | ICD-10-CM

## 2019-06-22 DIAGNOSIS — M25552 Pain in left hip: Secondary | ICD-10-CM | POA: Insufficient documentation

## 2019-06-24 ENCOUNTER — Telehealth: Payer: Self-pay | Admitting: Orthopedic Surgery

## 2019-06-24 NOTE — Telephone Encounter (Signed)
Silva Bandy called for patient to advise the MRI has been completed and was told to call and advise that and someone told her Dr Marlou Sa would call with results.  [phone]

## 2019-06-24 NOTE — Telephone Encounter (Signed)
Please call with scan results. Thanks.

## 2019-06-26 ENCOUNTER — Telehealth: Payer: Self-pay | Admitting: Orthopedic Surgery

## 2019-06-26 DIAGNOSIS — M545 Low back pain, unspecified: Secondary | ICD-10-CM

## 2019-06-26 DIAGNOSIS — G8929 Other chronic pain: Secondary | ICD-10-CM

## 2019-06-26 NOTE — Telephone Encounter (Signed)
I talked to the patient at length about the issues.  Patient has severe spinal stenosis which is causing her left leg pain.  This was her daughter that I talked to.  She wants to try an injection with Dr. Zannie Kehr.  Please set that up.  Thank you

## 2019-06-26 NOTE — Telephone Encounter (Signed)
Please advise. Thanks.  

## 2019-06-26 NOTE — Telephone Encounter (Signed)
Patient's daughter called. She would like results from the MRI. Her call back number is [phone]

## 2019-06-27 NOTE — Addendum Note (Signed)
Addended byLaurann [place] on: 06/27/2019 07:43 AM   Modules accepted: Orders

## 2019-06-27 NOTE — Telephone Encounter (Signed)
Referral placed for Dr Ernestina Patches.

## 2019-07-11 ENCOUNTER — Other Ambulatory Visit: Payer: Self-pay

## 2019-07-11 ENCOUNTER — Ambulatory Visit: Payer: Self-pay

## 2019-07-11 ENCOUNTER — Encounter: Payer: Self-pay | Admitting: Physical Medicine and Rehabilitation

## 2019-07-11 ENCOUNTER — Ambulatory Visit: Payer: Medicare HMO | Admitting: Physical Medicine and Rehabilitation

## 2019-07-11 VITALS — BP 187/77 | HR 60

## 2019-07-11 DIAGNOSIS — M48062 Spinal stenosis, lumbar region with neurogenic claudication: Secondary | ICD-10-CM

## 2019-07-11 DIAGNOSIS — M5416 Radiculopathy, lumbar region: Secondary | ICD-10-CM | POA: Diagnosis not present

## 2019-07-11 DIAGNOSIS — M961 Postlaminectomy syndrome, not elsewhere classified: Secondary | ICD-10-CM | POA: Diagnosis not present

## 2019-07-11 MED ORDER — DEXAMETHASONE SODIUM PHOSPHATE 10 MG/ML IJ SOLN
15.0000 mg | Freq: Once | INTRAMUSCULAR | Status: AC
  Administered 2019-07-11: 15 mg

## 2019-07-11 MED ORDER — METHYLPREDNISOLONE ACETATE 80 MG/ML IJ SUSP: 40.0000 mg | Freq: Once | INTRAMUSCULAR | Status: DC

## 2019-07-11 NOTE — Progress Notes (Signed)
.  Numeric Pain Rating Scale and Functional Assessment Average Pain 7   In the last MONTH (on 0-10 scale) has pain interfered with the following?  1. General activity like being  able to carry out your everyday physical activities such as walking, climbing stairs, carrying groceries, or moving a chair?  Rating(7)   +Driver, -BT, -Dye Allergies.   

## 2019-07-16 ENCOUNTER — Other Ambulatory Visit: Payer: Self-pay | Admitting: Orthopedic Surgery

## 2019-07-16 NOTE — Telephone Encounter (Signed)
Ok to rf? 

## 2019-07-22 ENCOUNTER — Telehealth: Payer: Self-pay | Admitting: Physical Medicine and Rehabilitation

## 2019-07-22 NOTE — Telephone Encounter (Signed)
Patient is scheduled for 3/29. Needs auth for [id number].

## 2019-07-22 NOTE — Telephone Encounter (Signed)
Ok bilateral L3 tf in 2 months

## 2019-07-23 NOTE — Telephone Encounter (Signed)
Approved Authorization [id number] (Tracking [id number]) Effective dates 07/23/19-3/11-21

## 2019-08-19 DIAGNOSIS — E785 Hyperlipidemia, unspecified: Secondary | ICD-10-CM | POA: Diagnosis not present

## 2019-08-19 DIAGNOSIS — E039 Hypothyroidism, unspecified: Secondary | ICD-10-CM | POA: Diagnosis not present

## 2019-08-19 DIAGNOSIS — I5030 Unspecified diastolic (congestive) heart failure: Secondary | ICD-10-CM | POA: Diagnosis not present

## 2019-08-26 DIAGNOSIS — E785 Hyperlipidemia, unspecified: Secondary | ICD-10-CM | POA: Diagnosis not present

## 2019-08-26 DIAGNOSIS — E039 Hypothyroidism, unspecified: Secondary | ICD-10-CM | POA: Diagnosis not present

## 2019-08-26 DIAGNOSIS — I5030 Unspecified diastolic (congestive) heart failure: Secondary | ICD-10-CM | POA: Diagnosis not present

## 2019-08-26 DIAGNOSIS — N183 Chronic kidney disease, stage 3 unspecified: Secondary | ICD-10-CM | POA: Diagnosis not present

## 2019-09-09 ENCOUNTER — Encounter: Payer: Self-pay | Admitting: Physical Medicine and Rehabilitation

## 2019-09-09 ENCOUNTER — Ambulatory Visit: Payer: Self-pay

## 2019-09-09 ENCOUNTER — Ambulatory Visit (INDEPENDENT_AMBULATORY_CARE_PROVIDER_SITE_OTHER): Payer: Medicare HMO | Admitting: Physical Medicine and Rehabilitation

## 2019-09-09 ENCOUNTER — Other Ambulatory Visit: Payer: Self-pay

## 2019-09-09 VITALS — BP 149/60 | HR 57

## 2019-09-09 DIAGNOSIS — M48062 Spinal stenosis, lumbar region with neurogenic claudication: Secondary | ICD-10-CM | POA: Diagnosis not present

## 2019-09-09 DIAGNOSIS — M961 Postlaminectomy syndrome, not elsewhere classified: Secondary | ICD-10-CM

## 2019-09-09 DIAGNOSIS — M5416 Radiculopathy, lumbar region: Secondary | ICD-10-CM

## 2019-09-09 MED ORDER — BETAMETHASONE SOD PHOS & ACET 6 (3-3) MG/ML IJ SUSP
12.0000 mg | Freq: Once | INTRAMUSCULAR | Status: AC
  Administered 2019-09-09: 12 mg

## 2019-09-09 NOTE — Progress Notes (Signed)
[name] - 86 y.o. female MRN [id number]  Date of birth: [birth year]  Office Visit Note: Visit Date: 09/09/2019 PCP: [name], MD Referred by: [name], MD  Subjective: Chief Complaint  Patient presents with  . Lower Back - Pain  . Right Leg - Pain  . Left Leg - Pain   HPI:  [name] is a 86 y.o. female who comes in today For planned repeat bilateral L3 transforaminal epidural steroid injection for severe stenosis at this level with low back and bilateral hip and leg pain.  Prior injection in January actually gave her quite a bit of relief up until just recently.  She does ambulate with a walker.  No new weakness.  She has had prior lumbar surgery.  She said it did help her back more than her legs.  We will repeat the injection today would consider L for transforaminal injection versus potential interlaminar injection at an area where she did not have surgery.  ROS Otherwise per HPI.  Assessment & Plan: Visit Diagnoses:  1. Spinal stenosis of lumbar region with neurogenic claudication   2. Lumbar radiculopathy   3. Post laminectomy syndrome     Plan: No additional findings.   Meds & Orders:  Meds ordered this encounter  Medications  . betamethasone acetate-betamethasone sodium phosphate (CELESTONE) injection 12 mg    Orders Placed This Encounter  Procedures  . XR C-ARM NO REPORT  . Epidural Steroid injection    Follow-up: Return if symptoms worsen or fail to improve, for Consider Interlaminar approach or L4 level..   Procedures: No procedures performed  Lumbosacral Transforaminal Epidural Steroid Injection - Sub-Pedicular Approach with Fluoroscopic Guidance  Patient: [name]      Date of Birth: 07-20-1927 MRN: [id number] PCP: [name], MD      Visit Date: 09/09/2019   Universal Protocol:    Date/Time: 09/09/2019  Consent Given By: the patient  Position: PRONE  Additional Comments: Vital signs were monitored before and after the  procedure. Patient was prepped and draped in the usual sterile fashion. The correct patient, procedure, and site was verified.   Injection Procedure Details:  Procedure Site One Meds Administered:  Meds ordered this encounter  Medications  . betamethasone acetate-betamethasone sodium phosphate (CELESTONE) injection 12 mg    Laterality: Bilateral  Location/Site:  L3-L4  Needle size: 22 G  Needle type: Spinal  Needle Placement: Transforaminal  Findings:    -Comments: Excellent flow of contrast along the nerve and into the epidural space.  Procedure Details: After squaring off the end-plates to get a true AP view, the C-arm was positioned so that an oblique view of the foramen as noted above was visualized. The target area is just inferior to the "nose of the scotty dog" or sub pedicular. The soft tissues overlying this structure were infiltrated with 2-3 ml. of 1% Lidocaine without Epinephrine.  The spinal needle was inserted toward the target using a "trajectory" view along the fluoroscope beam.  Under AP and lateral visualization, the needle was advanced so it did not puncture dura and was located close the 6 O'Clock position of the pedical in AP tracterory. Biplanar projections were used to confirm position. Aspiration was confirmed to be negative for CSF and/or blood. A 1-2 ml. volume of Isovue-250 was injected and flow of contrast was noted at each level. Radiographs were obtained for documentation purposes.   After attaining the desired flow of contrast documented above, a 0.5 to 1.0 ml  test dose of 0.25% Marcaine was injected into each respective transforaminal space.  The patient was observed for 90 seconds post injection.  After no sensory deficits were reported, and normal lower extremity motor function was noted,   the above injectate was administered so that equal amounts of the injectate were placed at each foramen (level) into the transforaminal epidural  space.   Additional Comments:  The patient tolerated the procedure well Dressing: 2 x 2 sterile gauze and Band-Aid    Post-procedure details: Patient was observed during the procedure. Post-procedure instructions were reviewed.  Patient left the clinic in stable condition.      Clinical History: MRI LUMBAR SPINE WITHOUT CONTRAST  TECHNIQUE: Multiplanar, multisequence MR imaging of the lumbar spine was performed. No intravenous contrast was administered.  COMPARISON:  CT lumbar spine 01/08/2004.  FINDINGS: Segmentation:  Standard.  Alignment: 0.7 cm anterolisthesis L5 on S1 is unchanged. Trace anterolisthesis L3 on L4 is also unchanged. Mild convex right curvature noted.  Vertebrae: No fracture or worrisome lesion. Scattered hemangiomas noted.  Conus medullaris and cauda equina: Conus extends to the L1 level. Conus and cauda equina appear normal.  Paraspinal and other soft tissues: Bilateral renal cysts are seen.  Disc levels:  T11-12 is imaged in the sagittal plane only and negative.  T12-L1: Negative.  L1-2: Shallow disc bulge without stenosis.  L2-3: Loss of disc space height with a shallow bulge and small left foraminal protrusion. There is moderate left foraminal narrowing. The central canal and right foramen are open.  L3-4: Advanced facet degenerative change, bulky ligamentum flavum thickening and a broad-based disc bulge cause severe central canal and right worse than left subarticular recess narrowing. Severe left and moderate right foraminal narrowing are also seen. Small synovial cyst off the posterior margin of the left facet incidentally noted.  L4-5: Status post fusion.  The central canal and foramina are open.  L5-S1: The facet joints are fused. There is slight disc uncovering without a bulge. Marked right foraminal narrowing is unchanged. The central canal and left foramen are open.  IMPRESSION: Severe central canal  stenosis and right worse than left subarticular recess stenosis at L3-4 where there is bulky ligamentum flavum thickening and a large broad-based disc bulge. Severe left and moderate right foraminal narrowing are also seen at L3-4.  No change in marked foraminal narrowing at L5-S1 which is predominantly due to anterolisthesis.  Status post L4-5 fusion.  The central canal and foramina are open.   Electronically Signed   By: Inge Rise M.D.   On: 06/22/2019 11:35     Objective:  VS:  HT:    WT:   BMI:     BP:(!) 149/60  HR:(!) 57bpm  TEMP: ( )  RESP:  Physical Exam  Ortho Exam Imaging: No results found.

## 2019-09-09 NOTE — Procedures (Signed)
Lumbosacral Transforaminal Epidural Steroid Injection - Sub-Pedicular Approach with Fluoroscopic Guidance  Patient: [name]      Date of Birth: [birth year] MRN: [id number] PCP: [name], MD      Visit Date: 09/09/2019   Universal Protocol:    Date/Time: 09/09/2019  Consent Given By: the patient  Position: PRONE  Additional Comments: Vital signs were monitored before and after the procedure. Patient was prepped and draped in the usual sterile fashion. The correct patient, procedure, and site was verified.   Injection Procedure Details:  Procedure Site One Meds Administered:  Meds ordered this encounter  Medications  . betamethasone acetate-betamethasone sodium phosphate (CELESTONE) injection 12 mg    Laterality: Bilateral  Location/Site:  L3-L4  Needle size: 22 G  Needle type: Spinal  Needle Placement: Transforaminal  Findings:    -Comments: Excellent flow of contrast along the nerve and into the epidural space.  Procedure Details: After squaring off the end-plates to get a true AP view, the C-arm was positioned so that an oblique view of the foramen as noted above was visualized. The target area is just inferior to the "nose of the scotty dog" or sub pedicular. The soft tissues overlying this structure were infiltrated with 2-3 ml. of 1% Lidocaine without Epinephrine.  The spinal needle was inserted toward the target using a "trajectory" view along the fluoroscope beam.  Under AP and lateral visualization, the needle was advanced so it did not puncture dura and was located close the 6 O'Clock position of the pedical in AP tracterory. Biplanar projections were used to confirm position. Aspiration was confirmed to be negative for CSF and/or blood. A 1-2 ml. volume of Isovue-250 was injected and flow of contrast was noted at each level. Radiographs were obtained for documentation purposes.   After attaining the desired flow of contrast documented above, a 0.5 to  1.0 ml test dose of 0.25% Marcaine was injected into each respective transforaminal space.  The patient was observed for 90 seconds post injection.  After no sensory deficits were reported, and normal lower extremity motor function was noted,   the above injectate was administered so that equal amounts of the injectate were placed at each foramen (level) into the transforaminal epidural space.   Additional Comments:  The patient tolerated the procedure well Dressing: 2 x 2 sterile gauze and Band-Aid    Post-procedure details: Patient was observed during the procedure. Post-procedure instructions were reviewed.  Patient left the clinic in stable condition.

## 2019-09-09 NOTE — Procedures (Signed)
Lumbosacral Transforaminal Epidural Steroid Injection - Sub-Pedicular Approach with Fluoroscopic Guidance  Patient: [name]      Date of Birth: [birth year] MRN: [id number] PCP: [name], MD      Visit Date: 07/11/2019   Universal Protocol:    Date/Time: 07/11/2019  Consent Given By: the patient  Position: PRONE  Additional Comments: Vital signs were monitored before and after the procedure. Patient was prepped and draped in the usual sterile fashion. The correct patient, procedure, and site was verified.   Injection Procedure Details:  Procedure Site One Meds Administered:  Meds ordered this encounter  Medications  . DISCONTD: methylPREDNISolone acetate (DEPO-MEDROL) injection 40 mg  . dexamethasone (DECADRON) injection 15 mg    Laterality: Bilateral  Location/Site:  L3-L4  Needle size: 22 G  Needle type: Spinal  Needle Placement: Transforaminal  Findings:    -Comments: Excellent flow of contrast along the nerve and into the epidural space.  Procedure Details: After squaring off the end-plates to get a true AP view, the C-arm was positioned so that an oblique view of the foramen as noted above was visualized. The target area is just inferior to the "nose of the scotty dog" or sub pedicular. The soft tissues overlying this structure were infiltrated with 2-3 ml. of 1% Lidocaine without Epinephrine.  The spinal needle was inserted toward the target using a "trajectory" view along the fluoroscope beam.  Under AP and lateral visualization, the needle was advanced so it did not puncture dura and was located close the 6 O'Clock position of the pedical in AP tracterory. Biplanar projections were used to confirm position. Aspiration was confirmed to be negative for CSF and/or blood. A 1-2 ml. volume of Isovue-250 was injected and flow of contrast was noted at each level. Radiographs were obtained for documentation purposes.   After attaining the desired flow of  contrast documented above, a 0.5 to 1.0 ml test dose of 0.25% Marcaine was injected into each respective transforaminal space.  The patient was observed for 90 seconds post injection.  After no sensory deficits were reported, and normal lower extremity motor function was noted,   the above injectate was administered so that equal amounts of the injectate were placed at each foramen (level) into the transforaminal epidural space.   Additional Comments:  The patient tolerated the procedure well Dressing: 2 x 2 sterile gauze and Band-Aid    Post-procedure details: Patient was observed during the procedure. Post-procedure instructions were reviewed.  Patient left the clinic in stable condition.

## 2019-09-09 NOTE — Progress Notes (Signed)
 .  Numeric Pain Rating Scale and Functional Assessment Average Pain 6   In the last MONTH (on 0-10 scale) has pain interfered with the following?  1. General activity like being  able to carry out your everyday physical activities such as walking, climbing stairs, carrying groceries, or moving a chair?  Rating(6)   +Driver, -BT, -Dye Allergies.  

## 2019-09-09 NOTE — Progress Notes (Signed)
[name] - 86 y.o. female MRN [id number]  Date of birth: [birth year]  Office Visit Note: Visit Date: 07/11/2019 PCP: [name], MD Referred by: [name], MD  Subjective: Chief Complaint  Patient presents with  . Lower Back - Pain  . Right Leg - Pain  . Left Leg - Pain   HPI:  [name] is a 86 y.o. female who comes in today For planned bilateral L3 transforaminal epidural steroid injections request of Dr. Anderson Malta.  Patient has severe stenosis at L3-4 with left severe foraminal stenosis moderate right.  She is having low back pain bilateral radicular leg pain.  She is failed conservative care otherwise.  ROS Otherwise per HPI.  Assessment & Plan: Visit Diagnoses:  1. Spinal stenosis of lumbar region with neurogenic claudication   2. Lumbar radiculopathy   3. Post laminectomy syndrome     Plan: No additional findings.   Meds & Orders:  Meds ordered this encounter  Medications  . DISCONTD: methylPREDNISolone acetate (DEPO-MEDROL) injection 40 mg  . dexamethasone (DECADRON) injection 15 mg    Orders Placed This Encounter  Procedures  . XR C-ARM NO REPORT  . Epidural Steroid injection    Follow-up: Return in about 2 weeks (around 07/25/2019) for Recheck spine.   Procedures: No procedures performed  Lumbosacral Transforaminal Epidural Steroid Injection - Sub-Pedicular Approach with Fluoroscopic Guidance  Patient: [name]      Date of Birth: Mar 25, 1928 MRN: [id number] PCP: [name], MD      Visit Date: 07/11/2019   Universal Protocol:    Date/Time: 07/11/2019  Consent Given By: the patient  Position: PRONE  Additional Comments: Vital signs were monitored before and after the procedure. Patient was prepped and draped in the usual sterile fashion. The correct patient, procedure, and site was verified.   Injection Procedure Details:  Procedure Site One Meds Administered:  Meds ordered this encounter  Medications  . DISCONTD:  methylPREDNISolone acetate (DEPO-MEDROL) injection 40 mg  . dexamethasone (DECADRON) injection 15 mg    Laterality: Bilateral  Location/Site:  L3-L4  Needle size: 22 G  Needle type: Spinal  Needle Placement: Transforaminal  Findings:    -Comments: Excellent flow of contrast along the nerve and into the epidural space.  Procedure Details: After squaring off the end-plates to get a true AP view, the C-arm was positioned so that an oblique view of the foramen as noted above was visualized. The target area is just inferior to the "nose of the scotty dog" or sub pedicular. The soft tissues overlying this structure were infiltrated with 2-3 ml. of 1% Lidocaine without Epinephrine.  The spinal needle was inserted toward the target using a "trajectory" view along the fluoroscope beam.  Under AP and lateral visualization, the needle was advanced so it did not puncture dura and was located close the 6 O'Clock position of the pedical in AP tracterory. Biplanar projections were used to confirm position. Aspiration was confirmed to be negative for CSF and/or blood. A 1-2 ml. volume of Isovue-250 was injected and flow of contrast was noted at each level. Radiographs were obtained for documentation purposes.   After attaining the desired flow of contrast documented above, a 0.5 to 1.0 ml test dose of 0.25% Marcaine was injected into each respective transforaminal space.  The patient was observed for 90 seconds post injection.  After no sensory deficits were reported, and normal lower extremity motor function was noted,   the above injectate was administered  so that equal amounts of the injectate were placed at each foramen (level) into the transforaminal epidural space.   Additional Comments:  The patient tolerated the procedure well Dressing: 2 x 2 sterile gauze and Band-Aid    Post-procedure details: Patient was observed during the procedure. Post-procedure instructions were reviewed.  Patient  left the clinic in stable condition.     Clinical History: MRI LUMBAR SPINE WITHOUT CONTRAST  TECHNIQUE: Multiplanar, multisequence MR imaging of the lumbar spine was performed. No intravenous contrast was administered.  COMPARISON:  CT lumbar spine 01/08/2004.  FINDINGS: Segmentation:  Standard.  Alignment: 0.7 cm anterolisthesis L5 on S1 is unchanged. Trace anterolisthesis L3 on L4 is also unchanged. Mild convex right curvature noted.  Vertebrae: No fracture or worrisome lesion. Scattered hemangiomas noted.  Conus medullaris and cauda equina: Conus extends to the L1 level. Conus and cauda equina appear normal.  Paraspinal and other soft tissues: Bilateral renal cysts are seen.  Disc levels:  T11-12 is imaged in the sagittal plane only and negative.  T12-L1: Negative.  L1-2: Shallow disc bulge without stenosis.  L2-3: Loss of disc space height with a shallow bulge and small left foraminal protrusion. There is moderate left foraminal narrowing. The central canal and right foramen are open.  L3-4: Advanced facet degenerative change, bulky ligamentum flavum thickening and a broad-based disc bulge cause severe central canal and right worse than left subarticular recess narrowing. Severe left and moderate right foraminal narrowing are also seen. Small synovial cyst off the posterior margin of the left facet incidentally noted.  L4-5: Status post fusion.  The central canal and foramina are open.  L5-S1: The facet joints are fused. There is slight disc uncovering without a bulge. Marked right foraminal narrowing is unchanged. The central canal and left foramen are open.  IMPRESSION: Severe central canal stenosis and right worse than left subarticular recess stenosis at L3-4 where there is bulky ligamentum flavum thickening and a large broad-based disc bulge. Severe left and moderate right foraminal narrowing are also seen at L3-4.  No change in  marked foraminal narrowing at L5-S1 which is predominantly due to anterolisthesis.  Status post L4-5 fusion.  The central canal and foramina are open.   Electronically Signed   By: Inge Rise M.D.   On: 06/22/2019 11:35     Objective:  VS:  HT:    WT:   BMI:     BP:(!) 187/77  HR:60bpm  TEMP: ( )  RESP:  Physical Exam  Ortho Exam Imaging: No results found.

## 2019-10-07 DIAGNOSIS — E039 Hypothyroidism, unspecified: Secondary | ICD-10-CM | POA: Diagnosis not present

## 2019-10-07 DIAGNOSIS — Z0000 Encounter for general adult medical examination without abnormal findings: Secondary | ICD-10-CM | POA: Diagnosis not present

## 2019-10-07 DIAGNOSIS — Z136 Encounter for screening for cardiovascular disorders: Secondary | ICD-10-CM | POA: Diagnosis not present

## 2019-10-07 DIAGNOSIS — Z7189 Other specified counseling: Secondary | ICD-10-CM | POA: Diagnosis not present

## 2019-10-07 DIAGNOSIS — Z1331 Encounter for screening for depression: Secondary | ICD-10-CM | POA: Diagnosis not present

## 2019-10-07 DIAGNOSIS — Z139 Encounter for screening, unspecified: Secondary | ICD-10-CM | POA: Diagnosis not present

## 2019-10-07 DIAGNOSIS — Z1339 Encounter for screening examination for other mental health and behavioral disorders: Secondary | ICD-10-CM | POA: Diagnosis not present

## 2019-12-20 DIAGNOSIS — E785 Hyperlipidemia, unspecified: Secondary | ICD-10-CM | POA: Diagnosis not present

## 2019-12-20 DIAGNOSIS — E039 Hypothyroidism, unspecified: Secondary | ICD-10-CM | POA: Diagnosis not present

## 2019-12-25 ENCOUNTER — Ambulatory Visit: Payer: Medicare HMO | Admitting: Orthopedic Surgery

## 2019-12-25 DIAGNOSIS — M79605 Pain in left leg: Secondary | ICD-10-CM

## 2019-12-27 ENCOUNTER — Encounter: Payer: Self-pay | Admitting: Orthopaedic Surgery

## 2019-12-27 ENCOUNTER — Ambulatory Visit: Payer: Medicare HMO | Admitting: Orthopaedic Surgery

## 2019-12-27 DIAGNOSIS — M48062 Spinal stenosis, lumbar region with neurogenic claudication: Secondary | ICD-10-CM | POA: Diagnosis not present

## 2019-12-27 DIAGNOSIS — M48061 Spinal stenosis, lumbar region without neurogenic claudication: Secondary | ICD-10-CM | POA: Insufficient documentation
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
## 2019-12-28 ENCOUNTER — Encounter: Payer: Self-pay | Admitting: Orthopedic Surgery
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
## 2020-01-03 ENCOUNTER — Other Ambulatory Visit: Payer: Self-pay

## 2020-01-03 DIAGNOSIS — I5030 Unspecified diastolic (congestive) heart failure: Secondary | ICD-10-CM | POA: Diagnosis not present

## 2020-01-03 DIAGNOSIS — N183 Chronic kidney disease, stage 3 unspecified: Secondary | ICD-10-CM | POA: Diagnosis not present

## 2020-01-03 DIAGNOSIS — Z01818 Encounter for other preprocedural examination: Secondary | ICD-10-CM | POA: Diagnosis not present

## 2020-01-03 DIAGNOSIS — J449 Chronic obstructive pulmonary disease, unspecified: Secondary | ICD-10-CM | POA: Diagnosis not present

## 2020-01-06 NOTE — Progress Notes (Signed)
[id number] - [place], [place] - [address] [place] [address] Phone: [phone] Fax: [phone]      Your procedure is scheduled on Friday, July 30th.  Report to [facility] Main Entrance "A" at 7:00 A.M., and check in at the Admitting office.  Call this number if you have problems the morning of surgery:  [phone]  Call [phone] if you have any questions prior to your surgery date Monday-Friday 8am-4pm    Remember:  Do not eat after midnight the night before your surgery  You may drink clear liquids until 6:00 AM the morning of your surgery.   Clear liquids allowed are: Water, Non-Citrus Juices (without pulp), Carbonated Beverages, Clear Tea, Black Coffee Only, and Gatorade    Take these medicines the morning of surgery with A SIP OF WATER   Acebutolol (Sectral)  Albuterol Nebulizer - if needed  Amlodipine (Norvasc)   Atorvastatin (Lipitor)  Benadryl - if needed  Esomeprazole (Nexium)  Hydrocodone-Acetaminophen  Levothyroxine (Synthroid)  Methocarbamol (Robaxin)    As of today, STOP taking any Aspirin (unless otherwise instructed by your surgeon) Aleve, Naproxen, Ibuprofen, Motrin, Advil, Goody's, BC's, all herbal medications, fish oil, and all vitamins.                      Do not wear jewelry, make up, or nail polish            Do not wear lotions, powders, perfumes, or deodorant.            Do not shave 48 hours prior to surgery.              Do not bring valuables to the hospital.            [facility] is not responsible for any belongings or valuables.  Do NOT Smoke (Tobacco/Vaping) or drink Alcohol 24 hours prior to your procedure If you use a CPAP at night, you may bring all equipment for your overnight stay.   Contacts, glasses, dentures or bridgework may not be worn into surgery.      For patients admitted to the hospital, discharge time will be determined by your treatment team.   Patients discharged the day of  surgery will not be allowed to drive home, and someone needs to stay with them for 24 hours.    Special instructions:   [facility]- Preparing For Surgery  Before surgery, you can play an important role. Because skin is not sterile, your skin needs to be as free of germs as possible. You can reduce the number of germs on your skin by washing with CHG (chlorahexidine gluconate) Soap before surgery.  CHG is an antiseptic cleaner which kills germs and bonds with the skin to continue killing germs even after washing.    Oral Hygiene is also important to reduce your risk of infection.  Remember - BRUSH YOUR TEETH THE MORNING OF SURGERY WITH YOUR REGULAR TOOTHPASTE  Please do not use if you have an allergy to CHG or antibacterial soaps. If your skin becomes reddened/irritated stop using the CHG.  Do not shave (including legs and underarms) for at least 48 hours prior to first CHG shower. It is OK to shave your face.  Please follow these instructions carefully.   1. Shower the NIGHT BEFORE SURGERY and the MORNING OF SURGERY with CHG Soap.   2. If you chose to wash your hair, wash your hair first as usual with  your normal shampoo.  3. After you shampoo, rinse your hair and body thoroughly to remove the shampoo.  4. Use CHG as you would any other liquid soap. You can apply CHG directly to the skin and wash gently with a scrungie or a clean washcloth.   5. Apply the CHG Soap to your body ONLY FROM THE NECK DOWN.  Do not use on open wounds or open sores. Avoid contact with your eyes, ears, mouth and genitals (private parts). Wash Face and genitals (private parts)  with your normal soap.   6. Wash thoroughly, paying special attention to the area where your surgery will be performed.  7. Thoroughly rinse your body with warm water from the neck down.  8. DO NOT shower/wash with your normal soap after using and rinsing off the CHG Soap.  9. Pat yourself dry with a CLEAN TOWEL.  10. Wear CLEAN  PAJAMAS to bed the night before surgery  11. Place CLEAN SHEETS on your bed the night of your first shower and DO NOT SLEEP WITH PETS.   Day of Surgery: Wear Clean/Comfortable clothing the morning of surgery Do not apply any deodorants/lotions.   Remember to brush your teeth WITH YOUR REGULAR TOOTHPASTE.   Please read over the following fact sheets that you were given.

## 2020-01-07 ENCOUNTER — Encounter (HOSPITAL_COMMUNITY)
Admission: RE | Admit: 2020-01-07 | Discharge: 2020-01-07 | Disposition: A | Discharge status: Home / Self Care | Payer: Medicare HMO | Source: Ambulatory Visit | Attending: Orthopaedic Surgery | Admitting: Orthopaedic Surgery

## 2020-01-07 ENCOUNTER — Other Ambulatory Visit (HOSPITAL_COMMUNITY)
Admission: RE | Admit: 2020-01-07 | Discharge: 2020-01-07 | Disposition: A | Discharge status: Home / Self Care | Payer: Medicare HMO | Source: Ambulatory Visit | Attending: Orthopaedic Surgery | Admitting: Orthopaedic Surgery

## 2020-01-07 ENCOUNTER — Other Ambulatory Visit: Payer: Self-pay

## 2020-01-07 ENCOUNTER — Encounter (HOSPITAL_COMMUNITY): Payer: Self-pay

## 2020-01-07 DIAGNOSIS — Z01812 Encounter for preprocedural laboratory examination: Secondary | ICD-10-CM | POA: Insufficient documentation

## 2020-01-07 DIAGNOSIS — Z20822 Contact with and (suspected) exposure to covid-19: Secondary | ICD-10-CM | POA: Diagnosis not present

## 2020-01-07 HISTORY — DX: Unspecified asthma, uncomplicated: J45.909

## 2020-01-07 LAB — BASIC METABOLIC PANEL
Anion gap: 6 (ref 5–15)
BUN: 26 mg/dL — ABNORMAL HIGH (ref 8–23)
CO2: 28 mmol/L (ref 22–32)
Calcium: 9.8 mg/dL (ref 8.9–10.3)
Chloride: 105 mmol/L (ref 98–111)
Creatinine, Ser: 0.91 mg/dL (ref 0.44–1.00)
GFR calc Af Amer: >60 mL/min (ref >60)
GFR calc non Af Amer: 56 mL/min — ABNORMAL LOW (ref >60)
Glucose, Bld: 89 mg/dL (ref 70–99)
Potassium: 4.6 mmol/L (ref 3.5–5.1)
Sodium: 139 mmol/L (ref 135–145)

## 2020-01-07 LAB — CBC
HCT: 36.1 % (ref 36.0–46.0)
Hemoglobin: 10.9 g/dL — ABNORMAL LOW (ref 12.0–15.0)
MCH: 27.5 pg (ref 26.0–34.0)
MCHC: 30.2 g/dL (ref 30.0–36.0)
MCV: 90.9 fL (ref 80.0–100.0)
Platelets: 227 10*3/uL (ref 150–400)
RBC: 3.97 MIL/uL (ref 3.87–5.11)
RDW: 13.7 % (ref 11.5–15.5)
WBC: 9 10*3/uL (ref 4.0–10.5)
nRBC: 0 % (ref 0.0–0.2)

## 2020-01-07 LAB — SARS CORONAVIRUS 2 (TAT 6-24 HRS): SARS Coronavirus 2: NEGATIVE

## 2020-01-07 LAB — SURGICAL PCR SCREEN
MRSA, PCR: NEGATIVE
Staphylococcus aureus: NEGATIVE

## 2020-01-07 NOTE — Progress Notes (Signed)
PCP - Dr. Maryella Shivers Cardiologist - Patient denies  PPM/ICD - n/a Device Orders -  Rep Notified -   Chest x-ray - n/a EKG - 05/2019 per patient's son, requested records from [facility].  Get DOS if tracing is not received Stress Test - per patient's son, patient received a "work up" and believes they did one - records requested ECHO - per patient's son, patient received a "work up" and believes they did one - records requested Cardiac Cath - 2005  Sleep Study - patient denies CPAP -   Fasting Blood Sugar - n/a Checks Blood Sugar _____ times a day  Blood Thinner Instructions: n/a Aspirin Instructions: last dose 01/07/2020  ERAS Protcol - per anesthesia orders, clears until 0600 am morning of surgery PRE-SURGERY Ensure or G2- n/a  COVID TEST-  01/07/2020   Anesthesia review: yes, hx of CAD  Patient denies shortness of breath, fever, cough and chest pain at PAT appointment.  Patient has what appears to be pursed lip breathing however son states she is not short of breath but this is how she breaths ever since she was in the hospital at [facility] and was given the wrong pain medicine and went into respiratory failure and "she coded" due to medication administration.  VVS.   All instructions explained to the patient, with a verbal understanding of the material. Patient agrees to go over the instructions while at home for a better understanding. Patient also instructed to self quarantine after being tested for COVID-19. The opportunity to ask questions was provided.
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
## 2020-01-08 ENCOUNTER — Encounter: Payer: Self-pay | Admitting: Surgery

## 2020-01-08 ENCOUNTER — Ambulatory Visit (INDEPENDENT_AMBULATORY_CARE_PROVIDER_SITE_OTHER): Payer: Medicare HMO | Admitting: Surgery

## 2020-01-08 VITALS — BP 155/82 | HR 68

## 2020-01-08 DIAGNOSIS — M48062 Spinal stenosis, lumbar region with neurogenic claudication: Secondary | ICD-10-CM

## 2020-01-08 NOTE — Progress Notes (Signed)
86 year old white female with a history of L3-4 stenosis, low back pain and leg pain comes in for preop evaluation. States that symptoms are unchanged from previous visit. She is wanting to proceed with L3-4 decompression as scheduled. Today history and physical performed. We have not yet received preop medical and cardiac clearances. Our surgery scheduler is working on getting these. Surgical procedure discussed along with potential hospital stay. We will proceed pending clearances. All questions answered.

## 2020-01-08 NOTE — Anesthesia Preprocedure Evaluation (Addendum)
Anesthesia Evaluation  Patient identified by MRN, date of birth, ID band  Reviewed: Allergy & Precautions, NPO status , Patient's Chart, lab work & pertinent test results  Airway Mallampati: II  TM Distance: >3 FB     Dental   Pulmonary shortness of breath, asthma , COPD,    breath sounds clear to auscultation       Cardiovascular hypertension, + CAD   Rhythm:Regular Rate:Normal     Neuro/Psych  Headaches, Anxiety Depression    GI/Hepatic Neg liver ROS,   Endo/Other  Hypothyroidism   Renal/GU Renal disease     Musculoskeletal   Abdominal   Peds  Hematology   Anesthesia Other Findings   Reproductive/Obstetrics                           Anesthesia Physical Anesthesia Plan  ASA: III  Anesthesia Plan: General   Post-op Pain Management:    Induction: Intravenous  PONV Risk Score and Plan: 3 and Ondansetron, Dexamethasone and Midazolam  Airway Management Planned: Oral ETT  Additional Equipment:   Intra-op Plan:   Post-operative Plan: Extubation in OR  Informed Consent: I have reviewed the patients History and Physical, chart, labs and discussed the procedure including the risks, benefits and alternatives for the proposed anesthesia with the patient or authorized representative who has indicated his/her understanding and acceptance.     Dental advisory given  Plan Discussed with: Anesthesiologist  Anesthesia Plan Comments: (PAT note written by Myra Gianotti, PA-C. She required Narcan after opiate administration in 12/2018.    She was evaluated by PCP Maryella Shivers, MD on 7/23/2 for routine follow-up and preoperative evaluation. He wrote, "pt seen for upcoming lumbar surgery Pt has had 4 surgeries in 0355 , complicated by pt's purse lip breathing confused as resp distress Will have pat cleared as at baseline, no hypoxia, and REDS Vest Score today" (doucmented as "appropriate").   )       Anesthesia Quick Evaluation
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
## 2020-01-10 ENCOUNTER — Other Ambulatory Visit: Payer: Self-pay

## 2020-01-10 ENCOUNTER — Ambulatory Visit (HOSPITAL_COMMUNITY): Payer: Medicare HMO

## 2020-01-10 ENCOUNTER — Observation Stay (HOSPITAL_COMMUNITY)
Admission: RE | Admit: 2020-01-10 | Discharge: 2020-01-11 | Disposition: A | Discharge status: Home / Self Care | Payer: Medicare HMO | Attending: Orthopaedic Surgery | Admitting: Orthopaedic Surgery

## 2020-01-10 ENCOUNTER — Ambulatory Visit (HOSPITAL_COMMUNITY): Payer: Medicare HMO | Admitting: Certified Registered Nurse Anesthetist

## 2020-01-10 ENCOUNTER — Encounter (HOSPITAL_COMMUNITY): Payer: Self-pay | Admitting: Orthopaedic Surgery

## 2020-01-10 ENCOUNTER — Ambulatory Visit (HOSPITAL_COMMUNITY): Payer: Medicare HMO | Admitting: Vascular Surgery

## 2020-01-10 ENCOUNTER — Encounter (HOSPITAL_COMMUNITY)
Admission: RE | Disposition: A | Discharge status: Home / Self Care | Payer: Self-pay | Source: Home / Self Care | Attending: Orthopaedic Surgery

## 2020-01-10 DIAGNOSIS — M48062 Spinal stenosis, lumbar region with neurogenic claudication: Secondary | ICD-10-CM

## 2020-01-10 DIAGNOSIS — Z91040 Latex allergy status: Secondary | ICD-10-CM | POA: Insufficient documentation

## 2020-01-10 DIAGNOSIS — Z79899 Other long term (current) drug therapy: Secondary | ICD-10-CM | POA: Insufficient documentation

## 2020-01-10 DIAGNOSIS — K449 Diaphragmatic hernia without obstruction or gangrene: Secondary | ICD-10-CM | POA: Diagnosis not present

## 2020-01-10 DIAGNOSIS — Z01818 Encounter for other preprocedural examination: Secondary | ICD-10-CM

## 2020-01-10 DIAGNOSIS — I119 Hypertensive heart disease without heart failure: Secondary | ICD-10-CM | POA: Insufficient documentation

## 2020-01-10 DIAGNOSIS — M545 Low back pain: Secondary | ICD-10-CM | POA: Diagnosis present

## 2020-01-10 DIAGNOSIS — J449 Chronic obstructive pulmonary disease, unspecified: Secondary | ICD-10-CM | POA: Insufficient documentation

## 2020-01-10 DIAGNOSIS — M47816 Spondylosis without myelopathy or radiculopathy, lumbar region: Secondary | ICD-10-CM | POA: Diagnosis not present

## 2020-01-10 DIAGNOSIS — I10 Essential (primary) hypertension: Secondary | ICD-10-CM | POA: Diagnosis not present

## 2020-01-10 DIAGNOSIS — M48061 Spinal stenosis, lumbar region without neurogenic claudication: Principal | ICD-10-CM | POA: Insufficient documentation

## 2020-01-10 DIAGNOSIS — M47817 Spondylosis without myelopathy or radiculopathy, lumbosacral region: Secondary | ICD-10-CM | POA: Diagnosis not present

## 2020-01-10 DIAGNOSIS — Z7982 Long term (current) use of aspirin: Secondary | ICD-10-CM | POA: Diagnosis not present

## 2020-01-10 DIAGNOSIS — I2510 Atherosclerotic heart disease of native coronary artery without angina pectoris: Secondary | ICD-10-CM | POA: Diagnosis not present

## 2020-01-10 DIAGNOSIS — E039 Hypothyroidism, unspecified: Secondary | ICD-10-CM | POA: Insufficient documentation

## 2020-01-10 DIAGNOSIS — Z981 Arthrodesis status: Secondary | ICD-10-CM | POA: Diagnosis not present

## 2020-01-10 DIAGNOSIS — J45909 Unspecified asthma, uncomplicated: Secondary | ICD-10-CM | POA: Diagnosis not present

## 2020-01-10 DIAGNOSIS — I129 Hypertensive chronic kidney disease with stage 1 through stage 4 chronic kidney disease, or unspecified chronic kidney disease: Secondary | ICD-10-CM | POA: Diagnosis not present

## 2020-01-10 DIAGNOSIS — N189 Chronic kidney disease, unspecified: Secondary | ICD-10-CM | POA: Diagnosis not present

## 2020-01-10 DIAGNOSIS — Z419 Encounter for procedure for purposes other than remedying health state, unspecified: Secondary | ICD-10-CM

## 2020-01-10 DIAGNOSIS — Z853 Personal history of malignant neoplasm of breast: Secondary | ICD-10-CM | POA: Insufficient documentation

## 2020-01-10 DIAGNOSIS — M5416 Radiculopathy, lumbar region: Secondary | ICD-10-CM | POA: Diagnosis not present

## 2020-01-10 HISTORY — PX: LUMBAR LAMINECTOMY/DECOMPRESSION MICRODISCECTOMY: SHX5026

## 2020-01-10 SURGERY — LUMBAR LAMINECTOMY/DECOMPRESSION MICRODISCECTOMY
Anesthesia: General

## 2020-01-10 MED ORDER — 0.9 % SODIUM CHLORIDE (POUR BTL) OPTIME
TOPICAL | Status: DC | PRN
  Administered 2020-01-10: 1000 mL

## 2020-01-10 MED ORDER — ATORVASTATIN CALCIUM 80 MG PO TABS
80.0000 mg | ORAL_TABLET | Freq: Every day | ORAL | Status: DC
  Administered 2020-01-10: 80 mg via ORAL
  Filled 2020-01-10: qty 1

## 2020-01-10 MED ORDER — ONDANSETRON HCL 4 MG PO TABS: 4.0000 mg | ORAL_TABLET | Freq: Four times a day (QID) | ORAL | Status: DC | PRN

## 2020-01-10 MED ORDER — ONDANSETRON HCL 4 MG/2ML IJ SOLN
INTRAMUSCULAR | Status: DC | PRN
  Administered 2020-01-10: 4 mg via INTRAVENOUS

## 2020-01-10 MED ORDER — PHENYLEPHRINE 40 MCG/ML (10ML) SYRINGE FOR IV PUSH (FOR BLOOD PRESSURE SUPPORT)
PREFILLED_SYRINGE | INTRAVENOUS | Status: DC | PRN
  Administered 2020-01-10 (×2): 80 ug via INTRAVENOUS

## 2020-01-10 MED ORDER — FENTANYL CITRATE (PF) 250 MCG/5ML IJ SOLN
INTRAMUSCULAR | Status: AC
  Filled 2020-01-10: qty 5

## 2020-01-10 MED ORDER — ACEBUTOLOL HCL 200 MG PO CAPS
200.0000 mg | ORAL_CAPSULE | Freq: Every day | ORAL | Status: DC
  Administered 2020-01-10 – 2020-01-11 (×2): 200 mg via ORAL
  Filled 2020-01-10 (×2): qty 1

## 2020-01-10 MED ORDER — FUROSEMIDE 20 MG PO TABS: 20.0000 mg | ORAL_TABLET | ORAL | Status: DC | PRN

## 2020-01-10 MED ORDER — POLYETHYLENE GLYCOL 3350 17 G PO PACK: 17.0000 g | PACK | Freq: Every day | ORAL | Status: DC | PRN

## 2020-01-10 MED ORDER — POTASSIUM CHLORIDE CRYS ER 20 MEQ PO TBCR
20.0000 meq | EXTENDED_RELEASE_TABLET | Freq: Every morning | ORAL | Status: DC
  Administered 2020-01-11: 20 meq via ORAL
  Filled 2020-01-10: qty 1

## 2020-01-10 MED ORDER — CHLORHEXIDINE GLUCONATE 0.12 % MT SOLN
15.0000 mL | Freq: Once | OROMUCOSAL | Status: DC
  Filled 2020-01-10: qty 15

## 2020-01-10 MED ORDER — PHENOL 1.4 % MT LIQD: 1.0000 | OROMUCOSAL | Status: DC | PRN

## 2020-01-10 MED ORDER — FENTANYL CITRATE (PF) 250 MCG/5ML IJ SOLN
INTRAMUSCULAR | Status: DC | PRN
  Administered 2020-01-10: 50 ug via INTRAVENOUS
  Administered 2020-01-10: 100 ug via INTRAVENOUS
  Administered 2020-01-10: 50 ug via INTRAVENOUS

## 2020-01-10 MED ORDER — VITAMIN D 25 MCG (1000 UNIT) PO TABS
2000.0000 [IU] | ORAL_TABLET | Freq: Every day | ORAL | Status: DC
  Administered 2020-01-10: 2000 [IU] via ORAL
  Filled 2020-01-10: qty 2

## 2020-01-10 MED ORDER — ROPINIROLE HCL 1 MG PO TABS
1.0000 mg | ORAL_TABLET | Freq: Every day | ORAL | Status: DC
  Administered 2020-01-10: 1 mg via ORAL
  Filled 2020-01-10: qty 1

## 2020-01-10 MED ORDER — HYDRALAZINE HCL 20 MG/ML IJ SOLN
INTRAMUSCULAR | Status: DC | PRN
  Administered 2020-01-10: 5 mg via INTRAVENOUS

## 2020-01-10 MED ORDER — SUGAMMADEX SODIUM 200 MG/2ML IV SOLN
INTRAVENOUS | Status: DC | PRN
  Administered 2020-01-10: 200 mg via INTRAVENOUS

## 2020-01-10 MED ORDER — LIDOCAINE 2% (20 MG/ML) 5 ML SYRINGE
INTRAMUSCULAR | Status: DC | PRN
  Administered 2020-01-10: 80 mg via INTRAVENOUS

## 2020-01-10 MED ORDER — HYDROCODONE-ACETAMINOPHEN 5-325 MG PO TABS: 1.0000 | ORAL_TABLET | Freq: Four times a day (QID) | ORAL | Status: DC | PRN

## 2020-01-10 MED ORDER — ONDANSETRON HCL 4 MG/2ML IJ SOLN: 4.0000 mg | Freq: Four times a day (QID) | INTRAMUSCULAR | Status: DC | PRN

## 2020-01-10 MED ORDER — PHENYLEPHRINE HCL-NACL 10-0.9 MG/250ML-% IV SOLN
INTRAVENOUS | Status: DC | PRN
  Administered 2020-01-10: 20 ug/min via INTRAVENOUS

## 2020-01-10 MED ORDER — GLYCOPYRROLATE PF 0.2 MG/ML IJ SOSY
PREFILLED_SYRINGE | INTRAMUSCULAR | Status: DC | PRN
  Administered 2020-01-10: .1 mg via INTRAVENOUS

## 2020-01-10 MED ORDER — DEXAMETHASONE SODIUM PHOSPHATE 10 MG/ML IJ SOLN
INTRAMUSCULAR | Status: DC | PRN
  Administered 2020-01-10: 10 mg via INTRAVENOUS

## 2020-01-10 MED ORDER — MENTHOL 3 MG MT LOZG: 1.0000 | LOZENGE | OROMUCOSAL | Status: DC | PRN

## 2020-01-10 MED ORDER — LACTATED RINGERS IV SOLN: INTRAVENOUS | Status: DC

## 2020-01-10 MED ORDER — ROCURONIUM BROMIDE 10 MG/ML (PF) SYRINGE
PREFILLED_SYRINGE | INTRAVENOUS | Status: DC | PRN
  Administered 2020-01-10: 50 mg via INTRAVENOUS
  Administered 2020-01-10: 20 mg via INTRAVENOUS

## 2020-01-10 MED ORDER — BETHANECHOL CHLORIDE 25 MG PO TABS
25.0000 mg | ORAL_TABLET | Freq: Two times a day (BID) | ORAL | Status: DC
  Administered 2020-01-10 – 2020-01-11 (×2): 25 mg via ORAL
  Filled 2020-01-10 (×2): qty 1

## 2020-01-10 MED ORDER — ORAL CARE MOUTH RINSE: 15.0000 mL | Freq: Once | OROMUCOSAL | Status: DC

## 2020-01-10 MED ORDER — SODIUM CHLORIDE 0.9% FLUSH: 3.0000 mL | INTRAVENOUS | Status: DC | PRN

## 2020-01-10 MED ORDER — HYDROCODONE-ACETAMINOPHEN 5-325 MG PO TABS: 1.0000 | ORAL_TABLET | Freq: Four times a day (QID) | ORAL | 0 refills | Status: DC | PRN

## 2020-01-10 MED ORDER — CEFAZOLIN SODIUM-DEXTROSE 2-4 GM/100ML-% IV SOLN
2.0000 g | INTRAVENOUS | Status: AC
  Administered 2020-01-10: 2 g via INTRAVENOUS
  Filled 2020-01-10: qty 100

## 2020-01-10 MED ORDER — SODIUM CHLORIDE 0.9% FLUSH
3.0000 mL | Freq: Two times a day (BID) | INTRAVENOUS | Status: DC
  Administered 2020-01-10 (×2): 3 mL via INTRAVENOUS

## 2020-01-10 MED ORDER — LEVOTHYROXINE SODIUM 112 MCG PO TABS
112.0000 ug | ORAL_TABLET | Freq: Every day | ORAL | Status: DC
  Administered 2020-01-11: 112 ug via ORAL
  Filled 2020-01-10: qty 1

## 2020-01-10 MED ORDER — PROPOFOL 10 MG/ML IV BOLUS
INTRAVENOUS | Status: DC | PRN
  Administered 2020-01-10: 150 mg via INTRAVENOUS

## 2020-01-10 MED ORDER — ACETAMINOPHEN 500 MG PO TABS
500.0000 mg | ORAL_TABLET | ORAL | Status: DC | PRN
  Administered 2020-01-10: 1000 mg via ORAL
  Administered 2020-01-11 (×2): 500 mg via ORAL
  Filled 2020-01-10 (×2): qty 1
  Filled 2020-01-10: qty 2

## 2020-01-10 MED ORDER — SODIUM CHLORIDE 0.9 % IV SOLN: INTRAVENOUS | Status: DC

## 2020-01-10 MED ORDER — OLANZAPINE 10 MG PO TABS
10.0000 mg | ORAL_TABLET | Freq: Every day | ORAL | Status: DC
  Administered 2020-01-10: 10 mg via ORAL
  Filled 2020-01-10: qty 1

## 2020-01-10 MED ORDER — PANTOPRAZOLE SODIUM 40 MG PO TBEC
40.0000 mg | DELAYED_RELEASE_TABLET | Freq: Every day | ORAL | Status: DC
  Administered 2020-01-10: 40 mg via ORAL
  Filled 2020-01-10 (×2): qty 1

## 2020-01-10 MED ORDER — BUPIVACAINE HCL (PF) 0.25 % IJ SOLN
INTRAMUSCULAR | Status: AC
  Filled 2020-01-10: qty 30

## 2020-01-10 MED ORDER — FENTANYL CITRATE (PF) 100 MCG/2ML IJ SOLN: 25.0000 ug | INTRAMUSCULAR | Status: DC | PRN

## 2020-01-10 MED ORDER — ALBUTEROL SULFATE (2.5 MG/3ML) 0.083% IN NEBU: 2.5000 mg | INHALATION_SOLUTION | Freq: Four times a day (QID) | RESPIRATORY_TRACT | Status: DC | PRN

## 2020-01-10 MED ORDER — DOCUSATE SODIUM 100 MG PO CAPS
100.0000 mg | ORAL_CAPSULE | Freq: Two times a day (BID) | ORAL | Status: DC
  Administered 2020-01-10: 100 mg via ORAL
  Filled 2020-01-10: qty 1

## 2020-01-10 MED ORDER — BUPIVACAINE HCL (PF) 0.25 % IJ SOLN
INTRAMUSCULAR | Status: DC | PRN
  Administered 2020-01-10: 6 mL

## 2020-01-10 MED ORDER — EPHEDRINE SULFATE-NACL 50-0.9 MG/10ML-% IV SOSY
PREFILLED_SYRINGE | INTRAVENOUS | Status: DC | PRN
  Administered 2020-01-10: 5 mg via INTRAVENOUS

## 2020-01-10 MED ORDER — AMLODIPINE BESYLATE 5 MG PO TABS
2.5000 mg | ORAL_TABLET | Freq: Every day | ORAL | Status: DC
  Administered 2020-01-10 – 2020-01-11 (×2): 2.5 mg via ORAL
  Filled 2020-01-10 (×2): qty 1

## 2020-01-10 SURGICAL SUPPLY — 42 items
BUR ROUND FLUTED 4 SOFT TCH (BURR) IMPLANT
BUR ROUND FLUTED 4MM SOFT TCH (BURR)
CANISTER SUCT 3000ML PPV (MISCELLANEOUS) ×3 IMPLANT
CLOSURE STERI-STRIP 1/2X4 (GAUZE/BANDAGES/DRESSINGS) ×1
CLSR STERI-STRIP ANTIMIC 1/2X4 (GAUZE/BANDAGES/DRESSINGS) ×2 IMPLANT
COVER SURGICAL LIGHT HANDLE (MISCELLANEOUS) ×3 IMPLANT
COVER WAND RF STERILE (DRAPES) ×3 IMPLANT
DECANTER SPIKE VIAL GLASS SM (MISCELLANEOUS) ×3 IMPLANT
DRAPE HALF SHEET 40X57 (DRAPES) ×6 IMPLANT
DRAPE MICROSCOPE LEICA (MISCELLANEOUS) ×3 IMPLANT
DRAPE SURG 17X23 STRL (DRAPES) ×3 IMPLANT
DRSG MEPILEX BORDER 4X4 (GAUZE/BANDAGES/DRESSINGS) ×3 IMPLANT
DURAPREP 26ML APPLICATOR (WOUND CARE) ×3 IMPLANT
ELECT REM PT RETURN 9FT ADLT (ELECTROSURGICAL) ×3
ELECTRODE REM PT RTRN 9FT ADLT (ELECTROSURGICAL) ×1 IMPLANT
GLOVE BIOGEL PI IND STRL 8 (GLOVE) ×2 IMPLANT
GLOVE BIOGEL PI INDICATOR 8 (GLOVE) ×4
GLOVE ORTHO TXT STRL SZ7.5 (GLOVE) ×6 IMPLANT
GOWN STRL REUS W/ TWL LRG LVL3 (GOWN DISPOSABLE) ×2 IMPLANT
GOWN STRL REUS W/ TWL XL LVL3 (GOWN DISPOSABLE) ×1 IMPLANT
GOWN STRL REUS W/TWL 2XL LVL3 (GOWN DISPOSABLE) ×3 IMPLANT
GOWN STRL REUS W/TWL LRG LVL3 (GOWN DISPOSABLE) ×6
GOWN STRL REUS W/TWL XL LVL3 (GOWN DISPOSABLE) ×3
KIT BASIN OR (CUSTOM PROCEDURE TRAY) ×3 IMPLANT
KIT TURNOVER KIT B (KITS) ×3 IMPLANT
MANIFOLD NEPTUNE II (INSTRUMENTS) ×3 IMPLANT
NEEDLE HYPO 25GX1X1/2 BEV (NEEDLE) ×3 IMPLANT
NEEDLE SPNL 18GX3.5 QUINCKE PK (NEEDLE) ×3 IMPLANT
NS IRRIG 1000ML POUR BTL (IV SOLUTION) ×3 IMPLANT
PACK LAMINECTOMY ORTHO (CUSTOM PROCEDURE TRAY) ×3 IMPLANT
PAD ARMBOARD 7.5X6 YLW CONV (MISCELLANEOUS) ×6 IMPLANT
PATTIES SURGICAL .5 X.5 (GAUZE/BANDAGES/DRESSINGS) IMPLANT
PATTIES SURGICAL .75X.75 (GAUZE/BANDAGES/DRESSINGS) IMPLANT
SUT VIC AB 0 CT1 27 (SUTURE)
SUT VIC AB 0 CT1 27XBRD ANBCTR (SUTURE) IMPLANT
SUT VIC AB 1 CTX 36 (SUTURE) ×3
SUT VIC AB 1 CTX36XBRD ANBCTR (SUTURE) ×1 IMPLANT
SUT VIC AB 2-0 CT1 27 (SUTURE) ×3
SUT VIC AB 2-0 CT1 TAPERPNT 27 (SUTURE) ×1 IMPLANT
SUT VIC AB 3-0 X1 27 (SUTURE) ×3 IMPLANT
TOWEL GREEN STERILE (TOWEL DISPOSABLE) ×3 IMPLANT
TOWEL GREEN STERILE FF (TOWEL DISPOSABLE) ×3 IMPLANT

## 2020-01-10 NOTE — Interval H&P Note (Signed)
History and Physical Interval Note:  01/10/2020 7:30 AM  [name]  has presented today for surgery, with the diagnosis of L3-4 SPINAL STENOSIS.  The various methods of treatment have been discussed with the patient and family. After consideration of risks, benefits and other options for treatment, the patient has consented to  Procedure(s): L3-4 LUMBAR DECOMPRESSION (N/A) as a surgical intervention.  The patient's history has been reviewed, patient examined, no change in status, stable for surgery.  I have reviewed the patient's chart and labs.  Questions were answered to the patient's satisfaction.     Marybelle Killings

## 2020-01-10 NOTE — Evaluation (Signed)
Physical Therapy Evaluation Patient Details Name: [name] MRN: [id number] DOB: 12/19/1927 Today's Date: 01/10/2020   History of Present Illness  Pt is a 86 y.o. F with significant PMH of cancer, COPD, L reverse TSA who presents with L3-4 stenosis now s/p decompression and removal of intraspinal extradural facet cyst.  Clinical Impression  Prior to admission, pt lives alone, uses a Rollator for mobility and is independent with ADL's. Pt son reports he is available to provide increased supervision/assist post op. Pt reports continued radicular pain into LLE down to knee with associated tingling. Intact to light touch upon assessment. Pt ambulating 60 feet with a walker at a min assist level. Presents with decreased endurance, weakness, balance deficits, and cognitive impairments. Would benefit from HHPT to address deficits and maximize functional mobility.     Follow Up Recommendations Home health PT;Supervision/Assistance - 24 hour    Equipment Recommendations  None recommended by PT (well equipped)   Recommendations for Other Services       Precautions / Restrictions Precautions Precautions: Fall;Back Precaution Booklet Issued: Yes (comment) Precaution Comments: Verbally reviewed and provided written handout Restrictions Weight Bearing Restrictions: No      Mobility  Bed Mobility Overal bed mobility: Needs Assistance Bed Mobility: Rolling;Sidelying to Sit;Sit to Sidelying Rolling: Modified independent (Device/Increase time) Sidelying to sit: Min guard     Sit to sidelying: Min assist General bed mobility comments: Cues for log roll technique, no physical assist for sidelying > sit, assist for LE elevation back into bed  Transfers Overall transfer level: Needs assistance Equipment used: Rolling walker (2 wheeled) Transfers: Sit to/from Stand Sit to Stand: Min assist         General transfer comment: MinA to rise from edge of bed. cues for hand  placement  Ambulation/Gait Ambulation/Gait assistance: Min assist Gait Distance (Feet): 60 Feet Assistive device: Rolling walker (2 wheeled) Gait Pattern/deviations: Step-through pattern;Decreased stride length;Drifts right/left;Trunk flexed Gait velocity: decreased Gait velocity interpretation: <1.8 ft/sec, indicate of risk for recurrent falls General Gait Details: Cues for walker proximity, upright posture, sequencing/direction. Pt requiring manual assist for maintaining walker on straight path  Stairs            Wheelchair Mobility    Modified Rankin (Stroke Patients Only)       Balance Overall balance assessment: Needs assistance Sitting-balance support: Feet supported Sitting balance-Leahy Scale: Good     Standing balance support: Bilateral upper extremity supported Standing balance-Leahy Scale: Poor                               Pertinent Vitals/Pain Pain Assessment: Faces Faces Pain Scale: Hurts little more Pain Location: surgical site with radicular symptoms into RLE Pain Descriptors / Indicators: Radiating Pain Intervention(s): Monitored during session    Home Living Family/patient expects to be discharged to:: Private residence Living Arrangements: Alone Available Help at Discharge: Family;Available 24 hours/day (son initially) Type of Home: House Home Access: [place]: One level Home Equipment: [place] - 2 wheels;Walker - 4 wheels;Bedside commode;Shower seat;Wheelchair - manual      Prior Function Level of Independence: Independent with assistive device(s)         Comments: Uses rollator, denies recent falls     Hand Dominance        Extremity/Trunk Assessment   Upper Extremity Assessment Upper Extremity Assessment: Defer to OT evaluation    Lower Extremity Assessment Lower Extremity  Assessment: RLE deficits/detail;LLE deficits/detail RLE Deficits / Details: At least anti gravity strength RLE  Sensation: WNL LLE Deficits / Details: At least anti gravity strength LLE Sensation: WNL    Cervical / Trunk Assessment Cervical / Trunk Assessment: Other exceptions Cervical / Trunk Exceptions: s/p L3-4 decompression  Communication   Communication: No difficulties  Cognition Arousal/Alertness: Awake/alert Behavior During Therapy: WFL for tasks assessed/performed Overall Cognitive Status: Impaired/Different from baseline Area of Impairment: Following commands;Attention;Problem solving                   Current Attention Level: Sustained   Following Commands: Follows one step commands consistently     Problem Solving: Difficulty sequencing;Requires verbal cues;Slow processing General Comments: Pt son reporting she is having some acute confusion post op. Pt following all 1 step commands, decreased interaction with therapist, requires cues for problem solving      General Comments      Exercises     Assessment/Plan    PT Assessment Patient needs continued PT services  PT Problem List Decreased strength;Decreased activity tolerance;Decreased balance;Decreased mobility;Decreased cognition;Decreased safety awareness;Pain       PT Treatment Interventions DME instruction;Gait training;Functional mobility training;Therapeutic activities;Therapeutic exercise;Balance training;Patient/family education    PT Goals (Current goals can be found in the Care Plan section)  Acute Rehab PT Goals Patient Stated Goal: pt son would like her to get stronger PT Goal Formulation: With patient Time For Goal Achievement: 01/24/20 Potential to Achieve Goals: Good    Frequency Min 5X/week   Barriers to discharge        Co-evaluation               AM-PAC PT "6 Clicks" Mobility  Outcome Measure Help needed turning from your back to your side while in a flat bed without using bedrails?: None Help needed moving from lying on your back to sitting on the side of a flat bed without  using bedrails?: A Little Help needed moving to and from a bed to a chair (including a wheelchair)?: A Little Help needed standing up from a chair using your arms (e.g., wheelchair or bedside chair)?: A Little Help needed to walk in hospital room?: A Little Help needed climbing 3-5 steps with a railing? : A Lot 6 Click Score: 18    End of Session Equipment Utilized During Treatment: Gait belt Activity Tolerance: Patient tolerated treatment well Patient left: in bed;with call bell/phone within reach;with family/visitor present Nurse Communication: Mobility status PT Visit Diagnosis: Pain;Unsteadiness on feet (R26.81);Muscle weakness (generalized) (M62.81);Difficulty in walking, not elsewhere classified (R26.2) Pain - part of body:  (back)    Time: 1531-1600 PT Time Calculation (min) (ACUTE ONLY): 29 min   Charges:   PT Evaluation $PT Eval Moderate Complexity: 1 Mod PT Treatments $Gait Training: 8-22 mins          Wyona Almas, PT, DPT Acute Rehabilitation Services Pager [phone] Office [phone]   Deno Etienne 01/10/2020, 5:22 PM

## 2020-01-10 NOTE — Transfer of Care (Signed)
Immediate Anesthesia Transfer of Care Note  Patient: [name]  Procedure(s) Performed: L3-4 LUMBAR DECOMPRESSION (N/A )  Patient Location: PACU  Anesthesia Type:General  Level of Consciousness: awake, alert , oriented and patient cooperative  Airway & Oxygen Therapy: Patient Spontanous Breathing and Patient connected to face mask oxygen  Post-op Assessment: Report given to RN and Post -op Vital signs reviewed and stable  Post vital signs: Reviewed and stable  Last Vitals:  Vitals Value Taken Time  BP    Temp    Pulse    Resp    SpO2      Last Pain: There were no vitals filed for this visit.    Patients Stated Pain Goal: 3 (16/10/96 0454)  Complications: No complications documented.

## 2020-01-10 NOTE — Anesthesia Procedure Notes (Signed)
Procedure Name: Intubation Date/Time: 01/10/2020 9:38 AM Performed by: Darletta Moll, CRNA Pre-anesthesia Checklist: Patient identified, Emergency Drugs available, Suction available and Patient being monitored Patient Re-evaluated:Patient Re-evaluated prior to induction Oxygen Delivery Method: Circle system utilized Preoxygenation: Pre-oxygenation with 100% oxygen Induction Type: IV induction Ventilation: Mask ventilation without difficulty Laryngoscope Size: Mac and 3 Grade View: Grade I Tube type: Oral Tube size: 7.0 mm Number of attempts: 1 Airway Equipment and Method: Stylet Placement Confirmation: ETT inserted through vocal cords under direct vision,  positive ETCO2 and breath sounds checked- equal and bilateral Secured at: 21 cm Tube secured with: Tape Dental Injury: Teeth and Oropharynx as per pre-operative assessment

## 2020-01-10 NOTE — Op Note (Addendum)
Preop diagnosis: L3-4 severe multifactorial spinal stenosis with radiculopathy with left sided intraspinal extradural facet cyst.  Postop diagnosis: Same  Procedure: Decompression L3-4 level complete laminectomy L3 with decompression and removal of intraspinal extradural facet cyst, central decompression. Operative microscope used for removal of facet cyst adherent to dura.  Surgeon: Rodell Perna, MD  Assistant: Benjiman Core, PA-C medically necessary and present for the entire procedure  Anesthesia General plus Marcaine local (allergic to lidocaine with rash but not Marcaine.)  EBL: See anesthetic record.  Procedure: After induction general anesthesia careful positioning of the patient's left reverse shoulder arthroplasty rolled yellow pads anterior to her shoulder and underneath ulnar nerve back was prepped with DuraPrep area squared with towel sterile skin marker on the old incision.  Betadine Steri-Drape laminectomy sheet and spinal needles were placed for localization.  Patient in severe osteopia and x-rays had to be sent to radiology for confirmation.  Second x-ray was taken once midline incision was made after Ancef prophylaxis and timeout procedure was completed.  Kocher clamps were placed just above and just below the L3-4 disc space.  Patient had previous fusion L4-5 15 years ago and had problems with spine only extended length in the hospital which was in [place] with headache problems.  There is fluid collection distal at the L4-5 level patient has had an interbody fusion but did not have any hardware present.  Second x-ray was confirmed by radiology in agreement with me that Kocher clamps were above below the L3-4 level.  Plan was for decompression doing a complete laminectomy at L3.  Possible column retractor was placed and L3 laminectomy was performed thinning the lamina with a 4 mm bur.  There was extremely large chunks of ligament which were resected.  Operative microscope was used and  with visualization of a very large intraspinal extradural facet cyst which was adherent to the dura.  We took some more bone laterally on both sides did the right side first with decompression at the level of pedicle to give more room and on the left side the cyst was adherent to the dura just above the L3 nerve root.  Trying to get a little bit more lateral of the cyst perforated leaking gelatinous material consistent with the ganglion cyst.  This was carefully suctioned dry and then the remaining portion of the cyst was removed just leaving tiny remnant adherent to the dura.  Nerve root was free and had been completely decompressed with removal of the facet cyst which took up about 60% of the canal space.  Once cyst was removed the dural tube was round.  Chunks of ligament removed right and left.  Copious irrigation.  Top portion of 4 was taken as well chunks of ligament were removed.  Both gutters were run to make sure there is no remaining bone spurs that could cause compression.  Copious irrigation closure with #1 undyed Vicryl 2-0 Vicryl subtendinous tissue skin staple closure Marcaine infiltration postop dressing and transferred to care room.  Instrument count needle count was correct.  Patient tolerated procedure well there were no complications.

## 2020-01-10 NOTE — Anesthesia Postprocedure Evaluation (Signed)
Anesthesia Post Note  Patient: [name]  Procedure(s) Performed: L3-4 LUMBAR DECOMPRESSION (N/A )     Patient location during evaluation: PACU Anesthesia Type: General Level of consciousness: awake Pain management: pain level controlled Vital Signs Assessment: post-procedure vital signs reviewed and stable Respiratory status: spontaneous breathing Postop Assessment: no apparent nausea or vomiting Anesthetic complications: no   No complications documented.  Last Vitals:  Vitals:   01/10/20 1215 01/10/20 1230  BP: (!) 166/64 (!) 122/93  Pulse: 78 79  Resp: 15 14  Temp: (!) 36.1 C   SpO2: 100% 96%    Last Pain:  Vitals:   01/10/20 1145  PainSc: 0-No pain                 Comfort Iversen

## 2020-01-10 NOTE — Discharge Instructions (Addendum)
Ok to shower 5 days postop.  Do not apply any creams or ointments to incision.  Can use 4x4 gauze and tape for dressing changes.  No aggressive activity.  No bending, twisting,lifting, squatting or prolonged sitting.  Mostly be in reclined position or lying down.     Call Your Doctor If Any of These Occur Redness, drainage, or swelling at the wound.  Temperature greater than 101 degrees. Severe pain not relieved by pain medication. Incision starts to come apart.

## 2020-01-11 ENCOUNTER — Encounter (HOSPITAL_COMMUNITY): Payer: Self-pay | Admitting: Orthopaedic Surgery

## 2020-01-11 DIAGNOSIS — I119 Hypertensive heart disease without heart failure: Secondary | ICD-10-CM | POA: Diagnosis not present

## 2020-01-11 DIAGNOSIS — J449 Chronic obstructive pulmonary disease, unspecified: Secondary | ICD-10-CM | POA: Diagnosis not present

## 2020-01-11 DIAGNOSIS — I2510 Atherosclerotic heart disease of native coronary artery without angina pectoris: Secondary | ICD-10-CM | POA: Diagnosis not present

## 2020-01-11 DIAGNOSIS — I129 Hypertensive chronic kidney disease with stage 1 through stage 4 chronic kidney disease, or unspecified chronic kidney disease: Secondary | ICD-10-CM | POA: Diagnosis not present

## 2020-01-11 DIAGNOSIS — J45909 Unspecified asthma, uncomplicated: Secondary | ICD-10-CM | POA: Diagnosis not present

## 2020-01-11 DIAGNOSIS — Z853 Personal history of malignant neoplasm of breast: Secondary | ICD-10-CM | POA: Diagnosis not present

## 2020-01-11 DIAGNOSIS — N189 Chronic kidney disease, unspecified: Secondary | ICD-10-CM | POA: Diagnosis not present

## 2020-01-11 DIAGNOSIS — E039 Hypothyroidism, unspecified: Secondary | ICD-10-CM | POA: Diagnosis not present

## 2020-01-11 DIAGNOSIS — M48061 Spinal stenosis, lumbar region without neurogenic claudication: Secondary | ICD-10-CM | POA: Diagnosis not present

## 2020-01-11 NOTE — TOC Transition Note (Signed)
Transition of Care [facility]) - CM/SW Discharge Note   Patient Details  Name: [name] MRN: [id number] Date of Birth: Nov 27, 1927  [facility] CM/SW Contact:  Carles Collet, RN Phone Number: 01/11/2020, 10:48 AM   Clinical Narrative:    Reading services arranged.     Final next level of care: [place] Barriers to Discharge: No Barriers Identified   Patient Goals and CMS Choice Patient states their goals for this hospitalization and ongoing recovery are:: to go home CMS Medicare.gov Compare Post Acute Care list provided to:: Patient Choice offered to / list presented to : Patient  Discharge Placement                       Discharge Plan and Services                          HH Arranged: PT [facility] Agency: [place] Date [place]: 01/11/20 Time [place]: [id number] Representative spoke with at [place]: [place] ([place]) Interventions     Readmission Risk Interventions No flowsheet data found.

## 2020-01-11 NOTE — Evaluation (Signed)
Occupational Therapy Evaluation and Discharge Patient Details Name: [name] MRN: [id number] DOB: 15-Dec-1927 Today's Date: 01/11/2020    History of Present Illness Pt is a 86 y.o. F with significant PMH of cancer, COPD, L reverse TSA who presents with L3-4 stenosis now s/p decompression and removal of intraspinal extradural facet cyst.   Clinical Impression   This 86 yo female admitted and underwent above presents to acute OT with al education completed, we will D/C from acute OT with follow up [place] recommended.    Follow Up Recommendations  Home health OT;Supervision/Assistance - 24 hour    Equipment Recommendations  None recommended by OT       Precautions / Restrictions Precautions Precautions: Fall;Back Precaution Booklet Issued: Yes (comment) Precaution Comments: Verbally reviewed handout in room with pt and her son, pt unable to recall precautions at beginning of session Restrictions Weight Bearing Restrictions: No      Mobility Bed Mobility Overal bed mobility: Needs Assistance Bed Mobility: Rolling;Sidelying to Sit Rolling: Supervision Sidelying to sit: Supervision       General bed mobility comments: Cues for sequencing, pt did use rail. Made pt and son aware that if she needs A to for her son to be her rail and not pull on her  Transfers Overall transfer level: Needs assistance Equipment used: Rolling walker (2 wheeled) Transfers: Sit to/from Stand Sit to Stand: Min guard         General transfer comment: VCs for safe hand placement    Balance Overall balance assessment: Needs assistance Sitting-balance support: Feet supported;No upper extremity supported Sitting balance-Leahy Scale: Good     Standing balance support: No upper extremity supported Standing balance-Leahy Scale: Fair Standing balance comment: standing at sink for grooming tasks                           ADL either performed or assessed with clinical judgement    ADL Overall ADL's : Needs assistance/impaired Eating/Feeding: Independent;Sitting   Grooming: Wash/dry hands;Oral care;Brushing hair;Min guard;Standing   Upper Body Bathing: Set up;Sitting   Lower Body Bathing: Min guard;Sit to/from stand Lower Body Bathing Details (indicate cue type and reason): simulated sponge bath Upper Body Dressing : Set up;Sitting   Lower Body Dressing: Min guard;Sit to/from stand Lower Body Dressing Details (indicate cue type and reason): pants and shoes Toilet Transfer: Min guard;Ambulation;Comfort height toilet;Grab bars;RW   Toileting- Water quality scientist and Hygiene: Min guard;Sit to/from stand         General ADL Comments: Pt educated on use of two cups for brushing teeth, use of wet wipes for back peri care, not sitting for more than 20-30 minutes at a time intially and can build up to an hour, do not really recommended reclining in recliner     Vision Baseline Vision/History: Wears glasses (right eye turns in) Wears Glasses: Reading only              Pertinent Vitals/Pain Pain Assessment: 0-10 Pain Score: 5  Pain Location: surgical site Pain Descriptors / Indicators: Aching;Sore Pain Intervention(s): Limited activity within patient's tolerance;Monitored during session (pt reports she does not want pain meds right now)     Hand Dominance Right   Extremity/Trunk Assessment Upper Extremity Assessment Upper Extremity Assessment: Generalized weakness           Communication Communication Communication: No difficulties   Cognition Arousal/Alertness: Awake/alert Behavior During Therapy: WFL for tasks assessed/performed Overall Cognitive Status: Impaired/Different from baseline Area  of Impairment: Memory                     Memory: Decreased recall of precautions         General Comments: No cues required for problem solving this AM              Home Living Family/patient expects to be discharged to:: Private  residence   Available Help at Discharge: Family;Available 24 hours/day (son will stay with her intially') Type of Home: House Home Access: Ramped entrance     Home Layout: One level     Bathroom Shower/Tub: Occupational psychologist: Handicapped height     Home Equipment: Environmental consultant - 2 wheels;Walker - 4 wheels;Bedside commode;Shower seat;Wheelchair - manual          Prior Functioning/Environment Level of Independence: Independent with assistive device(s)        Comments: Uses rollator, denies recent falls        OT Problem List: Decreased range of motion;Impaired balance (sitting and/or standing);Pain         OT Goals(Current goals can be found in the care plan section) Acute Rehab OT Goals Patient Stated Goal: to go home today  OT Frequency:                AM-PAC OT "6 Clicks" Daily Activity     Outcome Measure Help from another person eating meals?: None Help from another person taking care of personal grooming?: A Little Help from another person toileting, which includes using toliet, bedpan, or urinal?: A Little Help from another person bathing (including washing, rinsing, drying)?: A Little Help from another person to put on and taking off regular upper body clothing?: A Little Help from another person to put on and taking off regular lower body clothing?: A Little 6 Click Score: 19   End of Session Equipment Utilized During Treatment: Rolling walker Nurse Communication:  (HHOT recommended, no DME needs)  Activity Tolerance: Patient tolerated treatment well Patient left: in chair;with call bell/phone within reach;with family/visitor present  OT Visit Diagnosis: Other abnormalities of gait and mobility (R26.89);Pain;Muscle weakness (generalized) (M62.81) Pain - part of body:  (incisonal)                Time: 1219-7588 OT Time Calculation (min): 35 min Charges:  OT General Charges $OT Visit: 1 Visit OT Evaluation $OT Eval Moderate Complexity: 1  Mod OT Treatments $Self Care/Home Management : 8-22 mins  [place], OTR/L Acute [employer] Pager [phone] Office [phone]     Almon Register 01/11/2020, 8:40 AM

## 2020-01-11 NOTE — Plan of Care (Signed)
Patient discharged home with son at bedside. Discharged instructions given with stated understanding. Patient has an appointment with Dr. Lorin Mercy in 2 weeks. All belongings with son

## 2020-01-11 NOTE — Progress Notes (Signed)
Physical Therapy Treatment Patient Details Name: [name] MRN: [id number] DOB: [birth year] Today's Date: 01/11/2020    History of Present Illness Pt is a 86 y.o. F with significant PMH of cancer, COPD, L reverse TSA who presents with L3-4 stenosis now s/p decompression and removal of intraspinal extradural facet cyst.    PT Comments    Pt making steady progress overall with functional mobility. Plan is to d/c home today with family support. Pt would continue to benefit from skilled physical therapy services at this time while admitted and after d/c to address the below listed limitations in order to improve overall safety and independence with functional mobility.    Follow Up Recommendations  Home health PT;Supervision/Assistance - 24 hour     Equipment Recommendations  None recommended by PT    Recommendations for Other Services       Precautions / Restrictions Precautions Precautions: Fall;Back Precaution Booklet Issued: Yes (comment) Precaution Comments: verbally reviewed with pt throughout Restrictions Weight Bearing Restrictions: No    Mobility  Bed Mobility Overal bed mobility: Needs Assistance Bed Mobility: Rolling;Sidelying to Sit Rolling: Supervision Sidelying to sit: Supervision       General bed mobility comments: pt OOB in recliner chair upon arrival  Transfers Overall transfer level: Needs assistance Equipment used: Rolling walker (2 wheeled) Transfers: Sit to/from Stand Sit to Stand: Min guard         General transfer comment: VCs for safe hand placement  Ambulation/Gait Ambulation/Gait assistance: Min guard Gait Distance (Feet): 100 Feet Assistive device: Rolling walker (2 wheeled) Gait Pattern/deviations: Step-through pattern;Decreased stride length;Drifts right/left;Trunk flexed Gait velocity: decreased   General Gait Details: Cues for walker proximity, upright posture, sequencing/direction. Pt requiring manual assist for maintaining  walker on straight path   Stairs             Wheelchair Mobility    Modified Rankin (Stroke Patients Only)       Balance Overall balance assessment: Needs assistance Sitting-balance support: Feet supported;No upper extremity supported Sitting balance-Leahy Scale: Fair     Standing balance support: During functional activity Standing balance-Leahy Scale: Fair Standing balance comment: standing at sink for grooming tasks                            Cognition Arousal/Alertness: Awake/alert Behavior During Therapy: WFL for tasks assessed/performed Overall Cognitive Status: Impaired/Different from baseline Area of Impairment: Memory                     Memory: Decreased recall of precautions         General Comments: No cues required for problem solving this AM      Exercises      General Comments        Pertinent Vitals/Pain Pain Assessment: Faces Pain Score: 5  Faces Pain Scale: Hurts a little bit Pain Location: surgical site Pain Descriptors / Indicators: Aching;Sore Pain Intervention(s): Monitored during session;Repositioned    Home Living Family/patient expects to be discharged to:: Private residence   Available Help at Discharge: Family;Available 24 hours/day (son will stay with her intially') Type of Home: House Home Access: Ramped entrance   Home Layout: One level Home Equipment: [place] - 2 wheels;Walker - 4 wheels;Bedside commode;Shower seat;Wheelchair - manual      Prior Function Level of Independence: Independent with assistive device(s)      Comments: Uses rollator, denies recent falls   PT Goals (current goals can  now be found in the care plan section) Acute Rehab PT Goals Patient Stated Goal: to go home today PT Goal Formulation: With patient Time For Goal Achievement: 01/24/20 Potential to Achieve Goals: Good Progress towards PT goals: Progressing toward goals    Frequency    Min 5X/week      PT  Plan Current plan remains appropriate    Co-evaluation              AM-PAC PT "6 Clicks" Mobility   Outcome Measure  Help needed turning from your back to your side while in a flat bed without using bedrails?: None Help needed moving from lying on your back to sitting on the side of a flat bed without using bedrails?: A Little Help needed moving to and from a bed to a chair (including a wheelchair)?: A Little Help needed standing up from a chair using your arms (e.g., wheelchair or bedside chair)?: None Help needed to walk in hospital room?: A Little Help needed climbing 3-5 steps with a railing? : A Lot 6 Click Score: 19    End of Session Equipment Utilized During Treatment: Gait belt Activity Tolerance: Patient tolerated treatment well Patient left: in bed;with call bell/phone within reach;with family/visitor present;Other (comment) (seated EOB) Nurse Communication: Mobility status PT Visit Diagnosis: Pain;Unsteadiness on feet (R26.81);Muscle weakness (generalized) (M62.81);Difficulty in walking, not elsewhere classified (R26.2) Pain - part of body:  (back)     Time: 8921-1941 PT Time Calculation (min) (ACUTE ONLY): 10 min  Charges:  $Gait Training: 8-22 mins                     Anastasio Champion, DPT  Acute Rehabilitation Services Pager [phone] Office [place] 01/11/2020, 9:34 AM

## 2020-01-11 NOTE — Progress Notes (Signed)
     Subjective: 1 Day Post-Op Procedure(s) (LRB): L3-4 LUMBAR DECOMPRESSION (N/A) Awake, alert and oriented x 4. Up in bed and standing and walking with min assistance with walker. LE motor normal pain in Legs relieved. Voiding without difficulty.  Patient reports pain as moderate.    Objective:   VITALS:  Temp:  [97 F (36.1 C)-98.4 F (36.9 C)] 98.4 F (36.9 C) (07/31 0728) Pulse Rate:  [65-85] 75 (07/31 0728) Resp:  [14-22] 17 (07/31 0728) BP: (108-166)/(41-93) 131/48 (07/31 0728) SpO2:  [92 %-100 %] 97 % (07/31 0728)  Neurologically intact ABD soft Neurovascular intact Sensation intact distally Intact pulses distally Dorsiflexion/Plantar flexion intact Incision: dressing C/D/I and no drainage   LABS No results for input(s): HGB, WBC, PLT in the last 72 hours. No results for input(s): NA, K, CL, CO2, BUN, CREATININE, GLUCOSE in the last 72 hours. No results for input(s): LABPT, INR in the last 72 hours.   Assessment/Plan: 1 Day Post-Op Procedure(s) (LRB): L3-4 LUMBAR DECOMPRESSION (N/A)  Advance diet Up with therapy D/C IV fluids Discharge home with home health  Basil Dess 01/11/2020, 10:25 AM Patient ID: [name], female   DOB: [birth year], 86 y.o.   MRN: [id number]

## 2020-01-14 ENCOUNTER — Telehealth: Payer: Self-pay | Admitting: Orthopaedic Surgery

## 2020-01-14 DIAGNOSIS — M4807 Spinal stenosis, lumbosacral region: Secondary | ICD-10-CM | POA: Diagnosis not present

## 2020-01-14 NOTE — Telephone Encounter (Signed)
Annie Main (PT) with [facility] called asked if Dr Lorin Mercy will sign [place] orders? The number to contact Annie Main is [phone]

## 2020-01-15 NOTE — Telephone Encounter (Signed)
[facility]. Does not need HHPT. She is walking 6 x per day and pain is gone.

## 2020-01-15 NOTE — Telephone Encounter (Signed)
Informed [name]

## 2020-01-15 NOTE — Telephone Encounter (Signed)
Please advise.  Gait, balance, HEP No lifting, bending, or twisting

## 2020-01-22 ENCOUNTER — Ambulatory Visit (INDEPENDENT_AMBULATORY_CARE_PROVIDER_SITE_OTHER): Payer: Medicare HMO | Admitting: Orthopaedic Surgery

## 2020-01-22 ENCOUNTER — Encounter: Payer: Self-pay | Admitting: Orthopaedic Surgery

## 2020-01-22 DIAGNOSIS — Z9889 Other specified postprocedural states: Secondary | ICD-10-CM
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
## 2020-01-23 NOTE — Discharge Summary (Signed)
Patient ID: [name] MRN: [id number] DOB/AGE: [birth year] 86 y.o.  Admit date: 01/10/2020 Discharge date: 01/11/2020 Admission Diagnoses:  Active Problems:   Spinal stenosis of lumbar region   Lumbar stenosis   Discharge Diagnoses:  Active Problems:   Spinal stenosis of lumbar region   Lumbar stenosis  status post Procedure(s): L3-4 LUMBAR DECOMPRESSION  Past Medical History:  Diagnosis Date  . Acute diverticulitis 05/13/2019  . Anxiety   . Arthritis   . Asthma   . Cancer [facility])    breast cancer - right  . Chronic kidney disease   . Complication of anesthesia    possible bronchospam after extubation following extended back surgery; slow to wake up after anesthesia  . COPD (chronic obstructive pulmonary disease) ([place])   . Coronary artery disease    25% mLAD, 20% dLAD, 50% oRCA 04/15/04  . Depression   . Dyspnea    with exertion  . Hypertension   . Hypothyroidism   . Urinary incontinence     Surgeries: Procedure(s): L3-4 LUMBAR DECOMPRESSION on 01/10/2020   Consultants:   Discharged Condition: Improved  Hospital Course: [name] is an 86 y.o. female who was admitted 01/10/2020 for operative treatment of L3-4 stenosis. Patient failed conservative treatments (please see the history and physical for the specifics) and had severe unremitting pain that affects sleep, daily activities and work/hobbies. After pre-op clearance, the patient was taken to the operating room on 01/10/2020 and underwent  Procedure(s): L3-4 LUMBAR DECOMPRESSION.    Patient was given perioperative antibiotics:  Anti-infectives (From admission, onward)   Start     Dose/Rate Route Frequency Ordered Stop   01/10/20 0715  ceFAZolin (ANCEF) IVPB 2g/100 mL premix        2 g 200 mL/hr over 30 Minutes Intravenous On call to O.R. 01/10/20 0707 01/10/20 1006       Patient was given sequential compression devices and early ambulation to prevent DVT.   Patient benefited maximally from  hospital stay and there were no complications. At the time of discharge, the patient was urinating/moving their bowels without difficulty, tolerating a regular diet, pain is controlled with oral pain medications and they have been cleared by PT/OT.   Recent vital signs: No data found.   Recent laboratory studies: No results for input(s): WBC, HGB, HCT, PLT, NA, K, CL, CO2, BUN, CREATININE, GLUCOSE, INR, CALCIUM in the last 72 hours.  Invalid input(s): PT, 2   Discharge Medications:   Allergies as of 01/11/2020      Reactions   Latex Shortness Of Breath, Rash   Procaine Hcl    Sulfonamide Derivatives Itching   Lidocaine Rash      Medication List    STOP taking these medications   aspirin EC 81 MG tablet   diphenhydrAMINE 25 MG tablet Commonly known as: BENADRYL   ibuprofen 200 MG tablet Commonly known as: ADVIL   methocarbamol 500 MG tablet Commonly known as: ROBAXIN   traMADol 50 MG tablet Commonly known as: ULTRAM     TAKE these medications   acebutolol 200 MG capsule Commonly known as: SECTRAL Take 200 mg by mouth daily.   albuterol (2.5 MG/3ML) 0.083% nebulizer solution Commonly known as: PROVENTIL Take 2.5 mg by nebulization every 6 (six) hours as needed for wheezing or shortness of breath.   amLODipine 2.5 MG tablet Commonly known as: NORVASC Take 2.5 mg by mouth daily.   atorvastatin 80 MG tablet Commonly known as: LIPITOR Take 80 mg by mouth at  bedtime.   bethanechol 25 MG tablet Commonly known as: URECHOLINE Take 25 mg by mouth 2 (two) times daily after a meal.   esomeprazole 20 MG capsule Commonly known as: NEXIUM Take 20 mg by mouth daily.   furosemide 20 MG tablet Commonly known as: LASIX Take 20-40 mg by mouth as needed for fluid.   HYDROcodone-acetaminophen 5-325 MG tablet Commonly known as: Norco Take 1 tablet by mouth every 6 (six) hours as needed for moderate pain. What changed: reasons to take this   ICY HOT EX Apply 1 application  topically daily as needed (pain).   Klor-Con M20 20 MEQ tablet Generic drug: potassium chloride SA Take 20 mEq by mouth every morning.   levothyroxine 112 MCG tablet Commonly known as: SYNTHROID Take 112 mcg by mouth daily before breakfast.   multivitamin with minerals Tabs tablet Take 1 tablet by mouth daily. Alive women's + 50   OLANZapine 10 MG tablet Commonly known as: ZYPREXA Take 10 mg by mouth at bedtime.   rOPINIRole 0.5 MG tablet Commonly known as: REQUIP Take 1 mg by mouth at bedtime.   Vitamin D3 50 MCG (2000 UT) Tabs Take 2,000 Units by mouth daily.       Diagnostic Studies: DG Chest 2 View  Result Date: 01/10/2020 CLINICAL DATA:  Preoperative assessment for lumbar surgery. Hypertension. EXAM: CHEST - 2 VIEW COMPARISON:  May 13, 2019 chest radiograph and chest CT FINDINGS: There is no edema or airspace opacity. Heart is upper normal in size with pulmonary vascularity normal. There is aortic atherosclerosis. There is apparent hiatal hernia. There is postoperative and posttraumatic change over the right hemithorax. There is a total shoulder replacement on the left. Bones are osteoporotic. IMPRESSION: No edema or airspace opacity. Heart is upper normal in size. Apparent hiatal hernia. Postoperative and posttraumatic changes noted. Aortic Atherosclerosis (ICD10-I70.0). Electronically Signed   By: Lowella Grip III M.D.   On: 01/10/2020 07:41   DG Lumbar Spine 1 View  Result Date: 01/10/2020 CLINICAL DATA:  Localization for lumbar surgery EXAM: LUMBAR SPINE - 1 VIEW COMPARISON:  January 10, 2020 study obtained earlier in the day FINDINGS: Lateral image obtained, time stamped 10:10: 49. Clamp tips are posterior to L2-3 and L4-5; the disc between the clamps is the L3-4 level. No fracture or spondylolisthesis. Multifocal arthropathy noted with disc space narrowing most severe at L4-5 and L5-S1. IMPRESSION: Metallic clamp tips are posterior to the L2-3 and L4-5 levels  respectively. The disc between these clamps is the L3-4 level. Multilevel arthropathy noted. No fracture or spondylolisthesis. These results were called by telephone at the time of interpretation on 01/10/2020 at 10:16 am to provider MARK YATES , who verbally acknowledged these results. Electronically Signed   By: Lowella Grip III M.D.   On: 01/10/2020 10:17   DG Spine Portable 1 View  Result Date: 01/10/2020 CLINICAL DATA:  Localization for lumbar surgery EXAM: PORTABLE SPINE - 1 VIEW COMPARISON:  None. FINDINGS: Lateral lumbar image time stamped 9:56:21 submitted. There are needles posterior to the mid L3 and posterior to the L4-5 levels. There is severe disc space narrowing at L4-5 and L5-S1 with milder disc space narrowing at other levels. No fracture or spondylolisthesis. IMPRESSION: Needles posterior to mid L3 and posterior to L4-5 levels. Multilevel arthropathy. Electronically Signed   By: Lowella Grip III M.D.   On: 01/10/2020 10:11    Discharge Instructions    Call MD / Call 911   Complete by: As directed  If you experience chest pain or shortness of breath, CALL 911 and be transported to the hospital emergency room.  If you develope a fever above 101 F, pus (white drainage) or increased drainage or redness at the wound, or calf pain, call your surgeon's office.   Constipation Prevention   Complete by: As directed    Drink plenty of fluids.  Prune juice may be helpful.  You may use a stool softener, such as Colace (over the counter) 100 mg twice a day.  Use MiraLax (over the counter) for constipation as needed.   Diet - low sodium heart healthy   Complete by: As directed    Discharge instructions   Complete by: As directed    No lifting greater than 10 lbs. Avoid bending, stooping and twisting. Walk in house for first 2 week them may start to get out slowly increasing distance by 3 weeks post op. Keep incision dry for 3 days, may use tegaderm or similar water impervious  dressing. Drink plenty of water, 4-5 glasses of water per day.  Take a laxative if you develop contipation, best treatment is preventitive and to eat plenty of roughage, hifiber, stay hydrated and take a stool softner.   Driving restrictions   Complete by: As directed    No driving.   Incentive spirometry RT   Complete by: As directed    Increase activity slowly as tolerated   Complete by: As directed    Lifting restrictions   Complete by: As directed    No lifting for 8 weeks       Follow-up Information    Schedule an appointment as soon as possible for a visit with Marybelle Killings, MD.   Specialty: Orthopedic Surgery Why: need return office visit with dr yates one week postop  Contact information: [place] [address] [phone]        Care, [facility] Follow up.   Specialty: Home Health Services Contact information: [place] [address] [phone]               Discharge Plan:  discharge to home  Disposition:     Signed: Benjiman Core for Rodell Perna MD 01/23/2020, 9:32 AM

## 2020-01-27 DIAGNOSIS — N952 Postmenopausal atrophic vaginitis: Secondary | ICD-10-CM | POA: Diagnosis not present

## 2020-01-27 DIAGNOSIS — R339 Retention of urine, unspecified: Secondary | ICD-10-CM | POA: Diagnosis not present

## 2020-02-12 ENCOUNTER — Encounter: Payer: Self-pay | Admitting: Orthopaedic Surgery

## 2020-02-12 ENCOUNTER — Ambulatory Visit (INDEPENDENT_AMBULATORY_CARE_PROVIDER_SITE_OTHER): Payer: Medicare HMO | Admitting: Orthopaedic Surgery

## 2020-02-12 VITALS — BP 150/72 | HR 62 | Ht 63.0 in | Wt 180.0 lb

## 2020-02-12 DIAGNOSIS — Z9889 Other specified postprocedural states: Secondary | ICD-10-CM
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
## 2020-03-06 DIAGNOSIS — Z9049 Acquired absence of other specified parts of digestive tract: Secondary | ICD-10-CM | POA: Diagnosis not present

## 2020-03-06 DIAGNOSIS — K8050 Calculus of bile duct without cholangitis or cholecystitis without obstruction: Secondary | ICD-10-CM | POA: Diagnosis not present

## 2020-03-06 DIAGNOSIS — K439 Ventral hernia without obstruction or gangrene: Secondary | ICD-10-CM | POA: Diagnosis not present

## 2020-03-18 DIAGNOSIS — M545 Low back pain, unspecified: Secondary | ICD-10-CM | POA: Diagnosis not present

## 2020-03-18 DIAGNOSIS — Z7689 Persons encountering health services in other specified circumstances: Secondary | ICD-10-CM | POA: Diagnosis not present

## 2020-03-18 DIAGNOSIS — I700 Atherosclerosis of aorta: Secondary | ICD-10-CM | POA: Diagnosis not present

## 2020-03-18 DIAGNOSIS — N2889 Other specified disorders of kidney and ureter: Secondary | ICD-10-CM | POA: Diagnosis not present

## 2020-03-18 DIAGNOSIS — Z23 Encounter for immunization: Secondary | ICD-10-CM | POA: Diagnosis not present

## 2020-05-18 DIAGNOSIS — E039 Hypothyroidism, unspecified: Secondary | ICD-10-CM | POA: Diagnosis not present

## 2020-05-18 DIAGNOSIS — E785 Hyperlipidemia, unspecified: Secondary | ICD-10-CM | POA: Diagnosis not present

## 2020-05-22 DIAGNOSIS — E039 Hypothyroidism, unspecified: Secondary | ICD-10-CM | POA: Diagnosis not present

## 2020-05-22 DIAGNOSIS — I700 Atherosclerosis of aorta: Secondary | ICD-10-CM | POA: Diagnosis not present

## 2020-05-22 DIAGNOSIS — I5030 Unspecified diastolic (congestive) heart failure: Secondary | ICD-10-CM | POA: Diagnosis not present

## 2020-05-22 DIAGNOSIS — N1831 Chronic kidney disease, stage 3a: Secondary | ICD-10-CM | POA: Diagnosis not present

## 2020-06-29 IMAGING — CR LEFT SHOULDER - 1 VIEW
1 series · 1 of 1 positions shown · non-contrast
Comparison: CT left shoulder dated November 27, 2018.

CLINICAL DATA: Left shoulder arthroplasty.

EXAM:
LEFT SHOULDER - 1 VIEW

[AP]
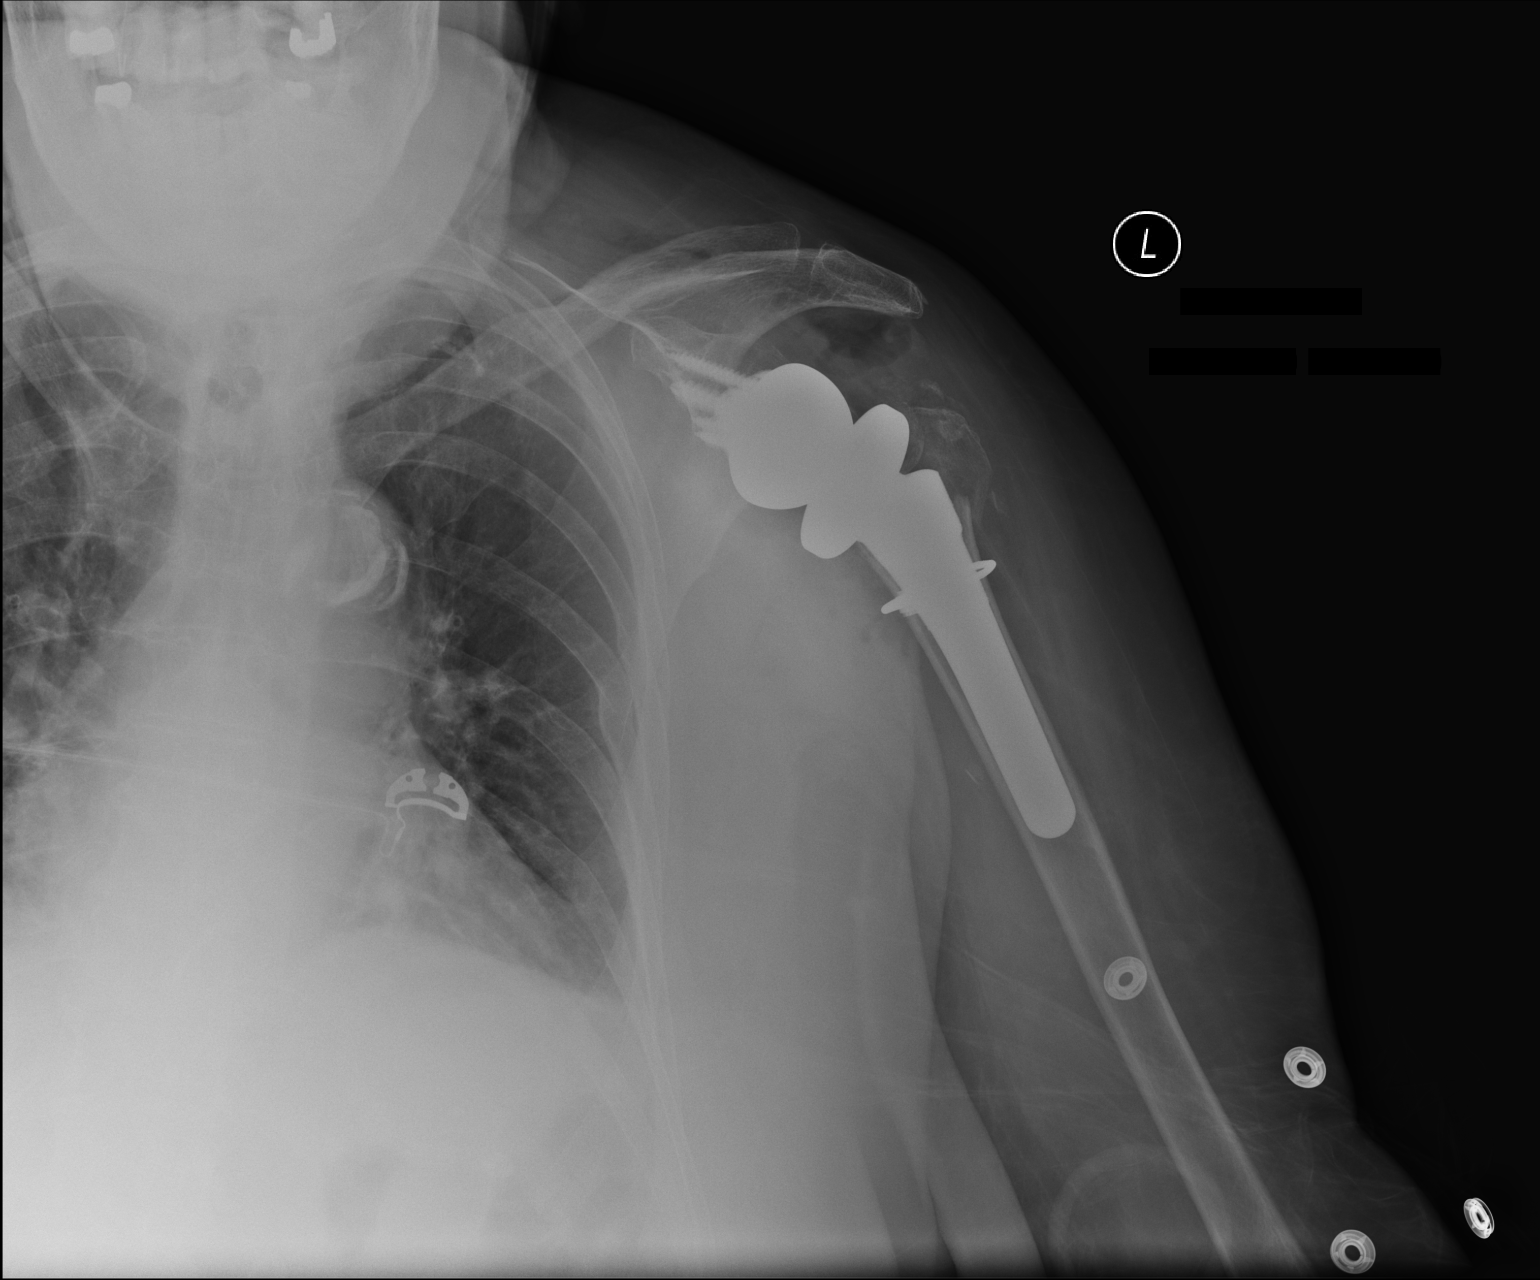

[1 of 1 positions shown; findings below may reference images not displayed]

FINDINGS: Interval left reverse total shoulder arthroplasty. Components are
well aligned. No acute osseous abnormality. Expected postsurgical
changes and subcutaneous emphysema around the left shoulder. Old
left-sided rib fractures.
IMPRESSION: 1. Interval left reverse total shoulder arthroplasty without acute
postoperative complication.

## 2020-07-03 DIAGNOSIS — N1831 Chronic kidney disease, stage 3a: Secondary | ICD-10-CM | POA: Diagnosis not present

## 2020-07-03 DIAGNOSIS — E039 Hypothyroidism, unspecified: Secondary | ICD-10-CM | POA: Diagnosis not present

## 2020-07-10 DIAGNOSIS — Z6833 Body mass index (BMI) 33.0-33.9, adult: Secondary | ICD-10-CM | POA: Diagnosis not present

## 2020-07-10 DIAGNOSIS — E039 Hypothyroidism, unspecified: Secondary | ICD-10-CM | POA: Diagnosis not present

## 2020-07-10 DIAGNOSIS — N1831 Chronic kidney disease, stage 3a: Secondary | ICD-10-CM | POA: Diagnosis not present

## 2020-07-29 DIAGNOSIS — N952 Postmenopausal atrophic vaginitis: Secondary | ICD-10-CM | POA: Diagnosis not present

## 2020-07-29 DIAGNOSIS — R339 Retention of urine, unspecified: Secondary | ICD-10-CM | POA: Diagnosis not present

## 2020-07-29 DIAGNOSIS — N390 Urinary tract infection, site not specified: Secondary | ICD-10-CM | POA: Diagnosis not present

## 2020-08-10 DIAGNOSIS — Z6834 Body mass index (BMI) 34.0-34.9, adult: Secondary | ICD-10-CM | POA: Diagnosis not present

## 2020-08-10 DIAGNOSIS — M79651 Pain in right thigh: Secondary | ICD-10-CM | POA: Diagnosis not present

## 2020-08-10 DIAGNOSIS — H9201 Otalgia, right ear: Secondary | ICD-10-CM | POA: Diagnosis not present

## 2020-08-10 DIAGNOSIS — R519 Headache, unspecified: Secondary | ICD-10-CM | POA: Diagnosis not present

## 2020-08-11 DIAGNOSIS — N1831 Chronic kidney disease, stage 3a: Secondary | ICD-10-CM | POA: Diagnosis not present

## 2020-08-11 DIAGNOSIS — I700 Atherosclerosis of aorta: Secondary | ICD-10-CM | POA: Diagnosis not present

## 2020-08-11 DIAGNOSIS — E039 Hypothyroidism, unspecified: Secondary | ICD-10-CM | POA: Diagnosis not present

## 2020-08-11 DIAGNOSIS — I5030 Unspecified diastolic (congestive) heart failure: Secondary | ICD-10-CM | POA: Diagnosis not present

## 2020-09-11 DIAGNOSIS — J449 Chronic obstructive pulmonary disease, unspecified: Secondary | ICD-10-CM | POA: Diagnosis not present

## 2020-09-11 DIAGNOSIS — I129 Hypertensive chronic kidney disease with stage 1 through stage 4 chronic kidney disease, or unspecified chronic kidney disease: Secondary | ICD-10-CM | POA: Diagnosis not present

## 2020-10-11 DIAGNOSIS — I129 Hypertensive chronic kidney disease with stage 1 through stage 4 chronic kidney disease, or unspecified chronic kidney disease: Secondary | ICD-10-CM | POA: Diagnosis not present

## 2020-10-11 DIAGNOSIS — J449 Chronic obstructive pulmonary disease, unspecified: Secondary | ICD-10-CM | POA: Diagnosis not present

## 2020-11-03 DIAGNOSIS — K5900 Constipation, unspecified: Secondary | ICD-10-CM | POA: Diagnosis not present

## 2020-11-03 DIAGNOSIS — R109 Unspecified abdominal pain: Secondary | ICD-10-CM | POA: Diagnosis not present

## 2020-11-03 DIAGNOSIS — R140 Abdominal distension (gaseous): Secondary | ICD-10-CM | POA: Diagnosis not present

## 2020-11-11 DIAGNOSIS — J449 Chronic obstructive pulmonary disease, unspecified: Secondary | ICD-10-CM | POA: Diagnosis not present

## 2020-11-11 DIAGNOSIS — I129 Hypertensive chronic kidney disease with stage 1 through stage 4 chronic kidney disease, or unspecified chronic kidney disease: Secondary | ICD-10-CM | POA: Diagnosis not present

## 2020-11-11 DIAGNOSIS — E039 Hypothyroidism, unspecified: Secondary | ICD-10-CM | POA: Diagnosis not present

## 2020-11-12 DIAGNOSIS — M47816 Spondylosis without myelopathy or radiculopathy, lumbar region: Secondary | ICD-10-CM | POA: Diagnosis not present

## 2020-11-12 DIAGNOSIS — K439 Ventral hernia without obstruction or gangrene: Secondary | ICD-10-CM | POA: Diagnosis not present

## 2020-11-12 DIAGNOSIS — K56609 Unspecified intestinal obstruction, unspecified as to partial versus complete obstruction: Secondary | ICD-10-CM | POA: Diagnosis not present

## 2020-11-12 DIAGNOSIS — K432 Incisional hernia without obstruction or gangrene: Secondary | ICD-10-CM | POA: Diagnosis not present

## 2020-11-12 DIAGNOSIS — K449 Diaphragmatic hernia without obstruction or gangrene: Secondary | ICD-10-CM | POA: Diagnosis not present

## 2020-11-12 DIAGNOSIS — R140 Abdominal distension (gaseous): Secondary | ICD-10-CM | POA: Diagnosis not present

## 2020-11-17 DIAGNOSIS — Z01818 Encounter for other preprocedural examination: Secondary | ICD-10-CM | POA: Diagnosis not present

## 2020-11-17 DIAGNOSIS — E785 Hyperlipidemia, unspecified: Secondary | ICD-10-CM | POA: Diagnosis not present

## 2020-11-17 DIAGNOSIS — I10 Essential (primary) hypertension: Secondary | ICD-10-CM | POA: Diagnosis not present

## 2020-11-17 DIAGNOSIS — K432 Incisional hernia without obstruction or gangrene: Secondary | ICD-10-CM | POA: Diagnosis not present

## 2020-11-17 DIAGNOSIS — Z6836 Body mass index (BMI) 36.0-36.9, adult: Secondary | ICD-10-CM | POA: Diagnosis not present

## 2020-11-17 DIAGNOSIS — K5733 Diverticulitis of large intestine without perforation or abscess with bleeding: Secondary | ICD-10-CM | POA: Diagnosis not present

## 2020-11-17 DIAGNOSIS — K660 Peritoneal adhesions (postprocedural) (postinfection): Secondary | ICD-10-CM | POA: Diagnosis not present

## 2020-11-17 DIAGNOSIS — E669 Obesity, unspecified: Secondary | ICD-10-CM | POA: Diagnosis not present

## 2020-11-17 DIAGNOSIS — R0602 Shortness of breath: Secondary | ICD-10-CM | POA: Diagnosis not present

## 2020-11-17 DIAGNOSIS — Z9049 Acquired absence of other specified parts of digestive tract: Secondary | ICD-10-CM | POA: Diagnosis not present

## 2020-11-17 DIAGNOSIS — E039 Hypothyroidism, unspecified: Secondary | ICD-10-CM | POA: Diagnosis not present

## 2020-11-17 DIAGNOSIS — J45909 Unspecified asthma, uncomplicated: Secondary | ICD-10-CM | POA: Diagnosis not present

## 2020-11-27 DIAGNOSIS — Z09 Encounter for follow-up examination after completed treatment for conditions other than malignant neoplasm: Secondary | ICD-10-CM | POA: Diagnosis not present

## 2020-11-27 DIAGNOSIS — N1831 Chronic kidney disease, stage 3a: Secondary | ICD-10-CM | POA: Diagnosis not present

## 2020-11-27 DIAGNOSIS — E039 Hypothyroidism, unspecified: Secondary | ICD-10-CM | POA: Diagnosis not present

## 2020-12-11 DIAGNOSIS — I129 Hypertensive chronic kidney disease with stage 1 through stage 4 chronic kidney disease, or unspecified chronic kidney disease: Secondary | ICD-10-CM | POA: Diagnosis not present

## 2020-12-11 DIAGNOSIS — J449 Chronic obstructive pulmonary disease, unspecified: Secondary | ICD-10-CM | POA: Diagnosis not present

## 2020-12-11 DIAGNOSIS — E039 Hypothyroidism, unspecified: Secondary | ICD-10-CM | POA: Diagnosis not present

## 2020-12-21 DIAGNOSIS — E039 Hypothyroidism, unspecified: Secondary | ICD-10-CM | POA: Diagnosis not present

## 2020-12-21 DIAGNOSIS — I10 Essential (primary) hypertension: Secondary | ICD-10-CM | POA: Diagnosis not present

## 2020-12-21 DIAGNOSIS — Z6832 Body mass index (BMI) 32.0-32.9, adult: Secondary | ICD-10-CM | POA: Diagnosis not present

## 2020-12-21 DIAGNOSIS — E7800 Pure hypercholesterolemia, unspecified: Secondary | ICD-10-CM | POA: Diagnosis not present

## 2021-03-23 DIAGNOSIS — R339 Retention of urine, unspecified: Secondary | ICD-10-CM | POA: Diagnosis not present

## 2021-03-23 DIAGNOSIS — I10 Essential (primary) hypertension: Secondary | ICD-10-CM | POA: Diagnosis not present

## 2021-03-23 DIAGNOSIS — G2581 Restless legs syndrome: Secondary | ICD-10-CM | POA: Diagnosis not present

## 2021-03-23 DIAGNOSIS — Z9181 History of falling: Secondary | ICD-10-CM | POA: Diagnosis not present

## 2021-03-23 DIAGNOSIS — F339 Major depressive disorder, recurrent, unspecified: Secondary | ICD-10-CM | POA: Diagnosis not present

## 2021-03-23 DIAGNOSIS — Z6834 Body mass index (BMI) 34.0-34.9, adult: Secondary | ICD-10-CM | POA: Diagnosis not present

## 2021-03-23 DIAGNOSIS — E039 Hypothyroidism, unspecified: Secondary | ICD-10-CM | POA: Diagnosis not present

## 2021-03-23 DIAGNOSIS — Z23 Encounter for immunization: Secondary | ICD-10-CM | POA: Diagnosis not present

## 2021-08-03 IMAGING — CR DG LUMBAR SPINE 1V
1 series · 1 of 1 positions shown · non-contrast
Comparison: January 10, 2020 study obtained earlier in the day

CLINICAL DATA: Localization for lumbar surgery

EXAM:
LUMBAR SPINE - 1 VIEW

[lateral]
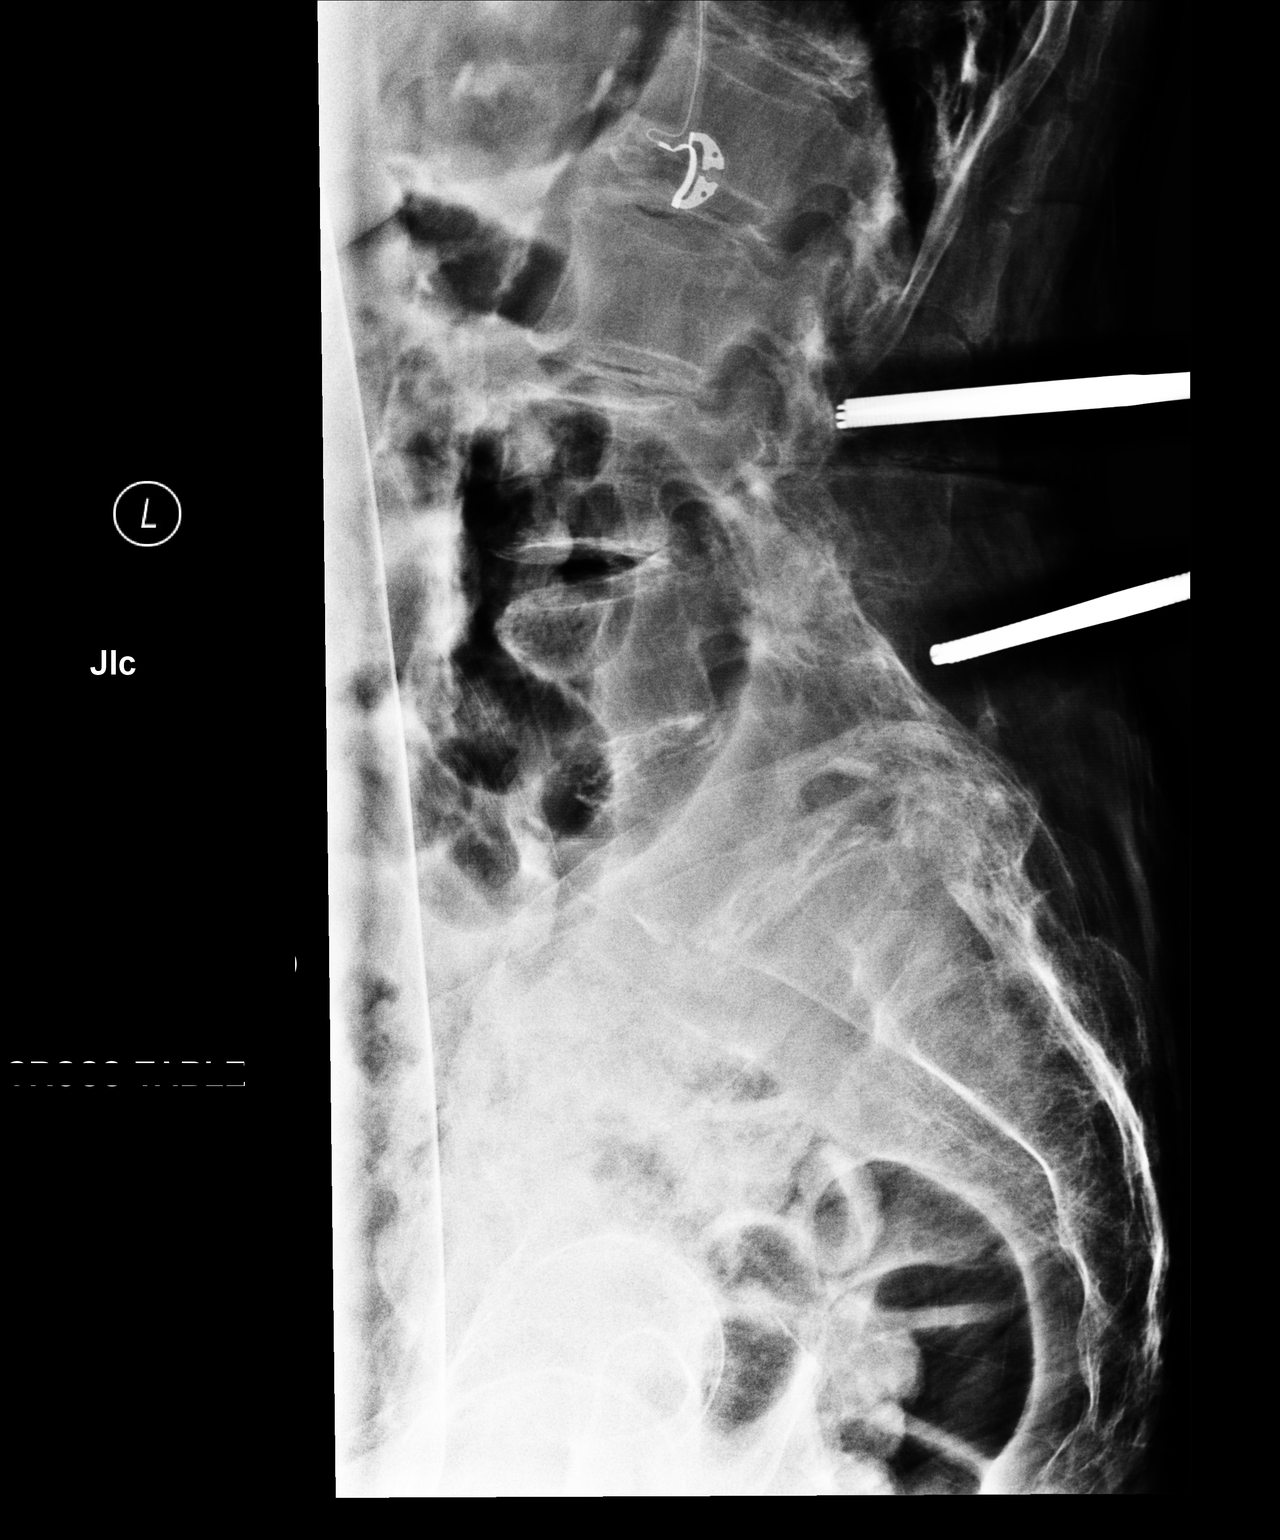

[1 of 1 positions shown; findings below may reference images not displayed]

FINDINGS: Lateral image obtained, time stamped [DATE]: 49. Clamp tips are
posterior to L2-3 and L4-5; the disc between the clamps is the L3-4
level. No fracture or spondylolisthesis. Multifocal arthropathy
noted with disc space narrowing most severe at L4-5 and L5-S1.
IMPRESSION: Metallic clamp tips are posterior to the L2-3 and L4-5 levels
respectively. The disc between these clamps is the L3-4 level.
Multilevel arthropathy noted. No fracture or spondylolisthesis.

These results were called by telephone at the time of interpretation
on 01/10/2020 at [DATE] to provider MUNISH FINK , who verbally
acknowledged these results.

## 2021-08-03 IMAGING — CR DG SPINE 1V PORT
1 series · 1 of 1 positions shown · non-contrast
Comparison: None.

CLINICAL DATA: Localization for lumbar surgery

EXAM:
PORTABLE SPINE - 1 VIEW

[lateral]
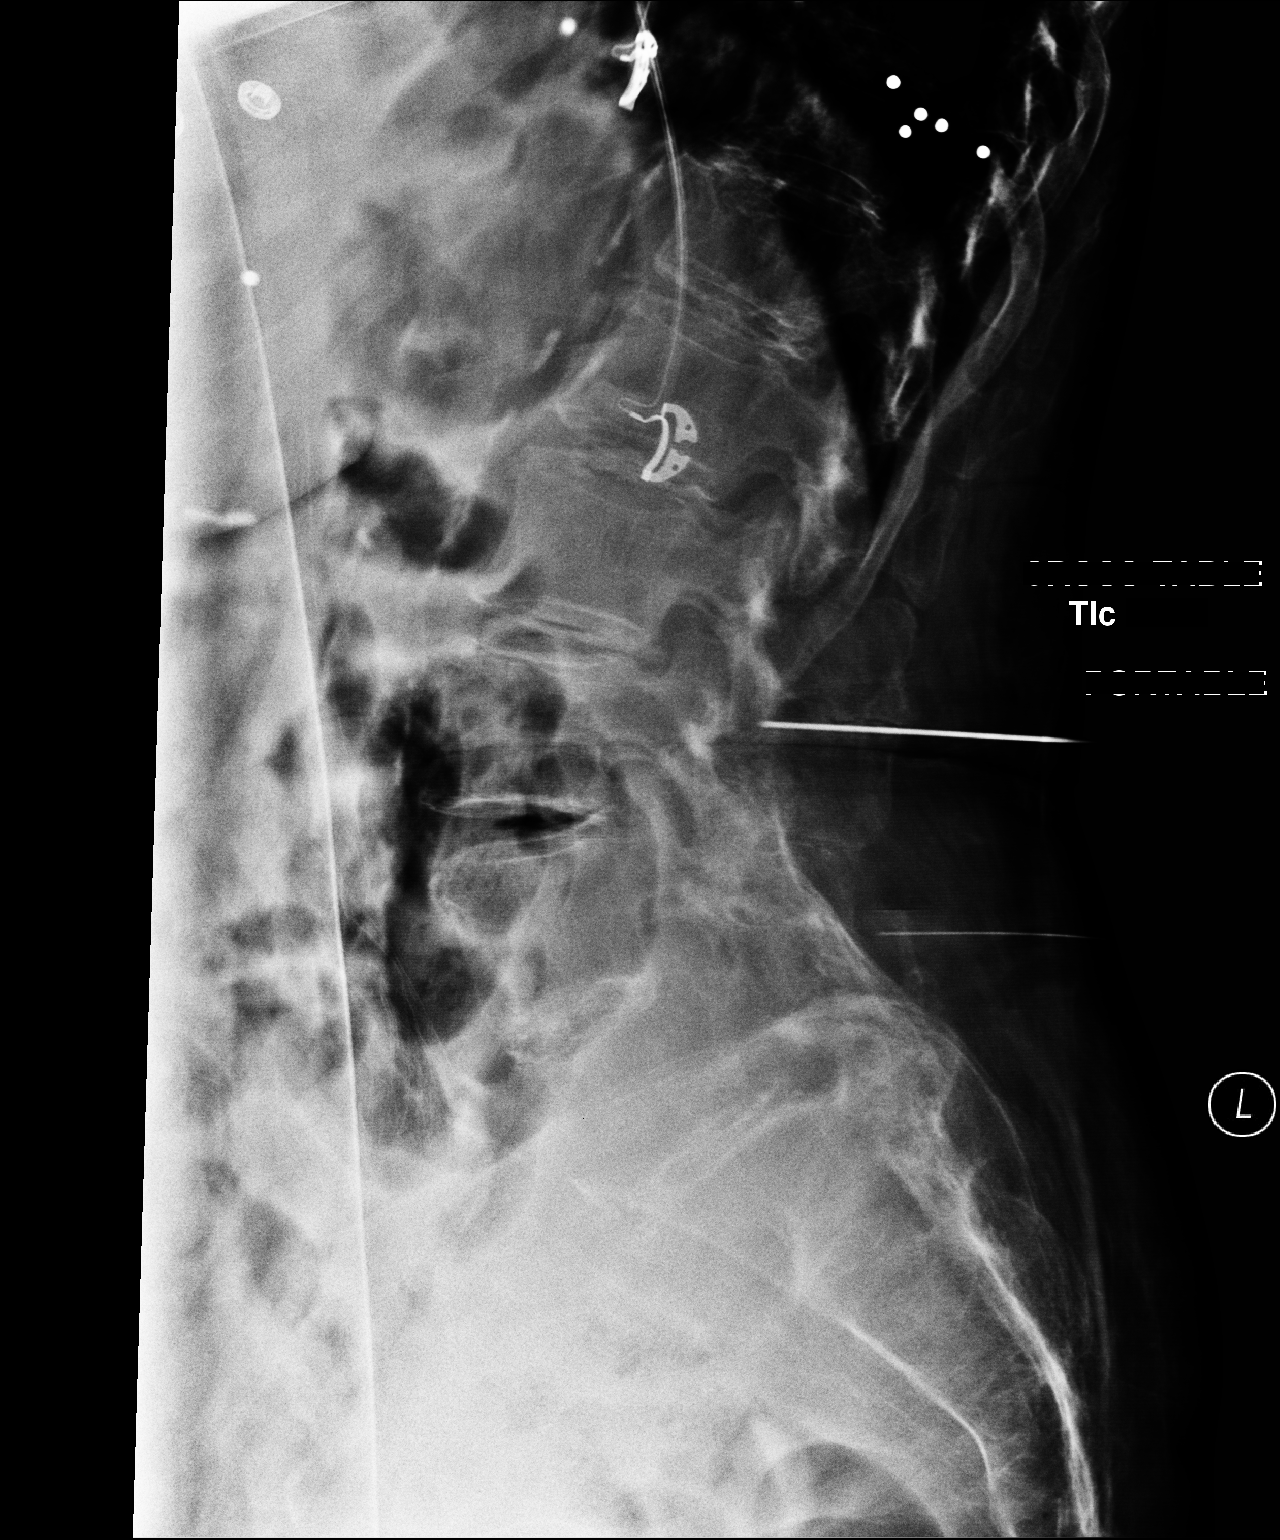

[1 of 1 positions shown; findings below may reference images not displayed]

FINDINGS: Lateral lumbar image time stamped [DATE] submitted. There are
needles posterior to the mid L3 and posterior to the L4-5 levels.
There is severe disc space narrowing at L4-5 and L5-S1 with milder
disc space narrowing at other levels. No fracture or
spondylolisthesis.
IMPRESSION: Needles posterior to mid L3 and posterior to L4-5 levels. Multilevel
arthropathy.

## 2021-08-03 IMAGING — CR DG CHEST 2V
2 series · 2 of 2 positions shown · non-contrast
Comparison: May 13, 2019 chest radiograph and chest CT

CLINICAL DATA: Preoperative assessment for lumbar surgery.
Hypertension.

EXAM:
CHEST - 2 VIEW

[w chest pa]
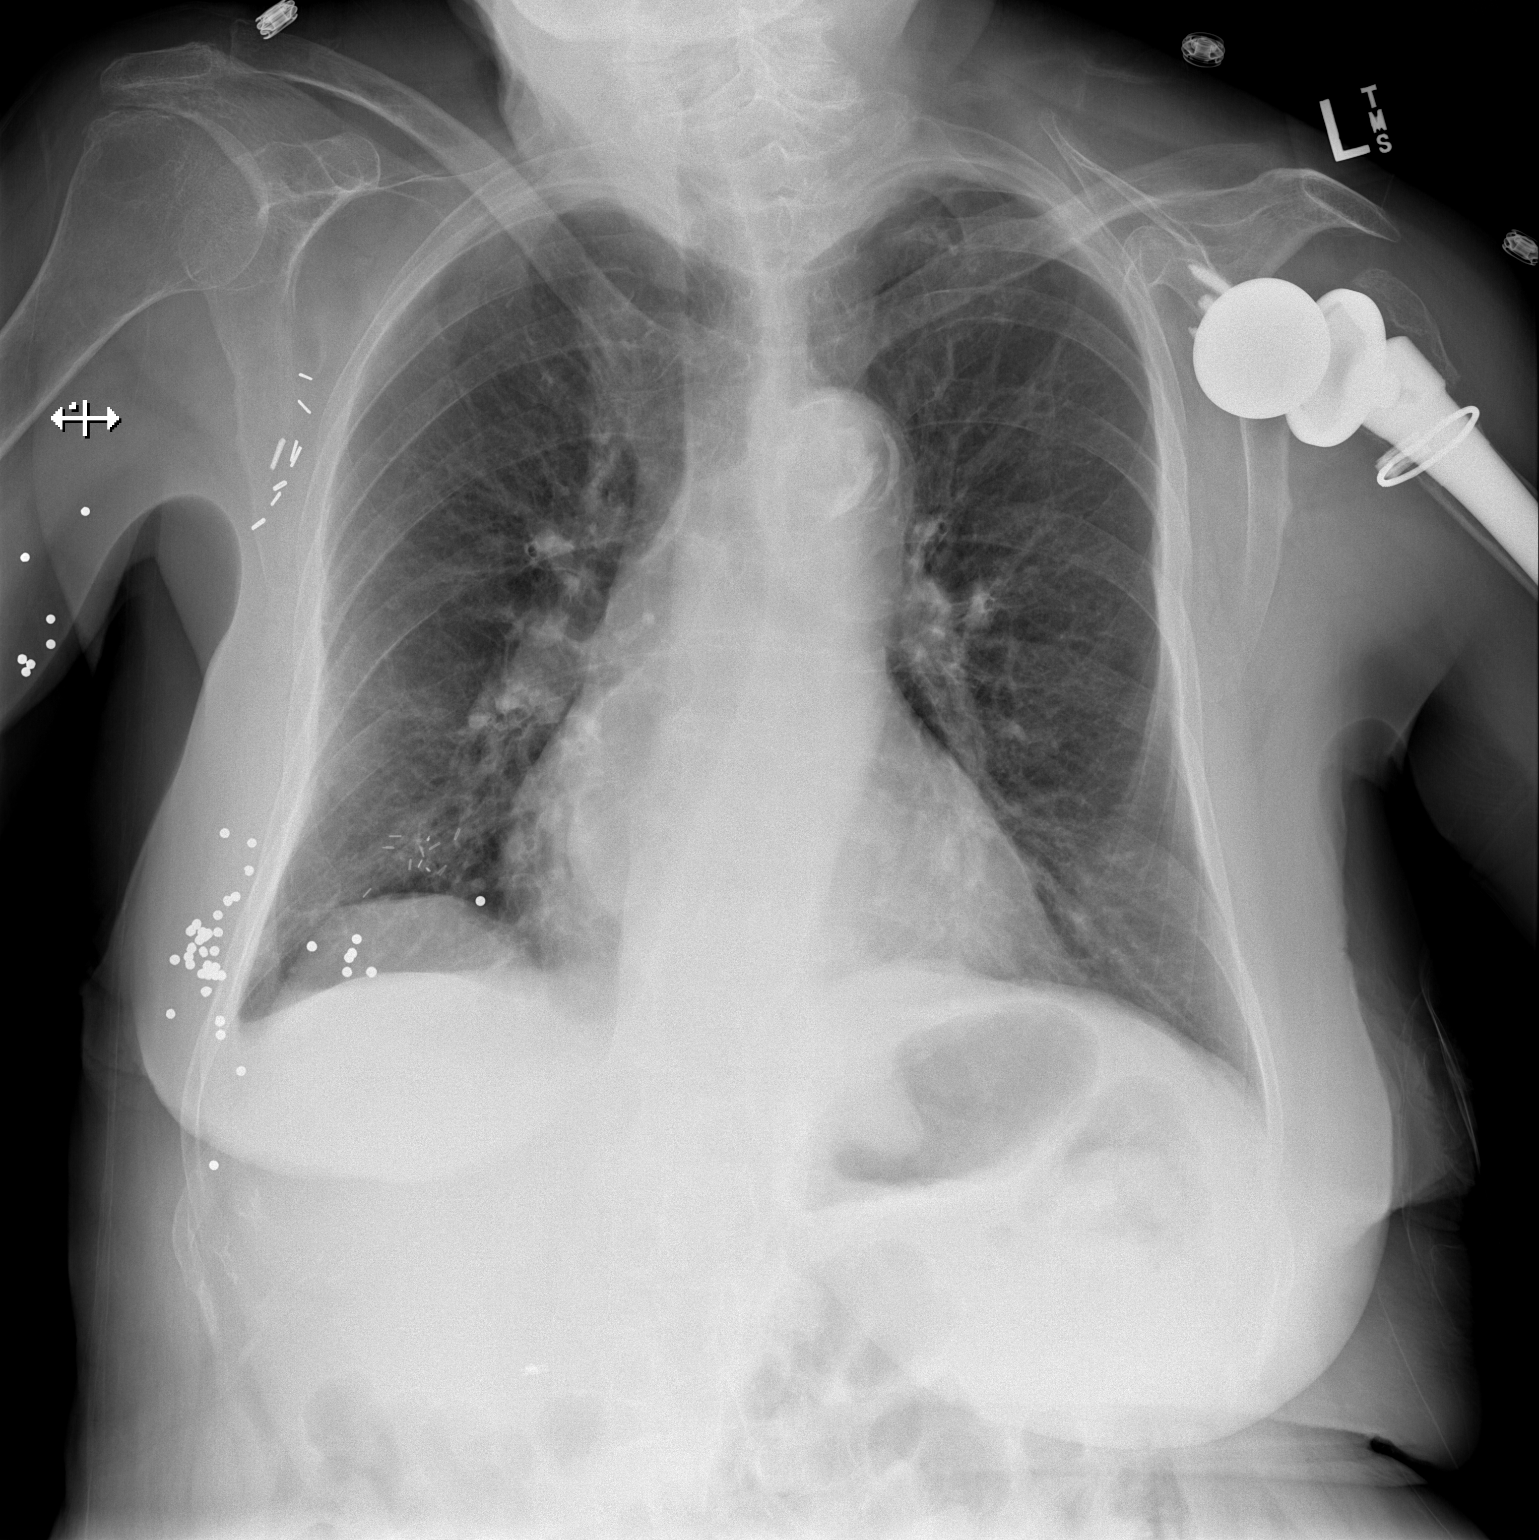

[w chest lat]
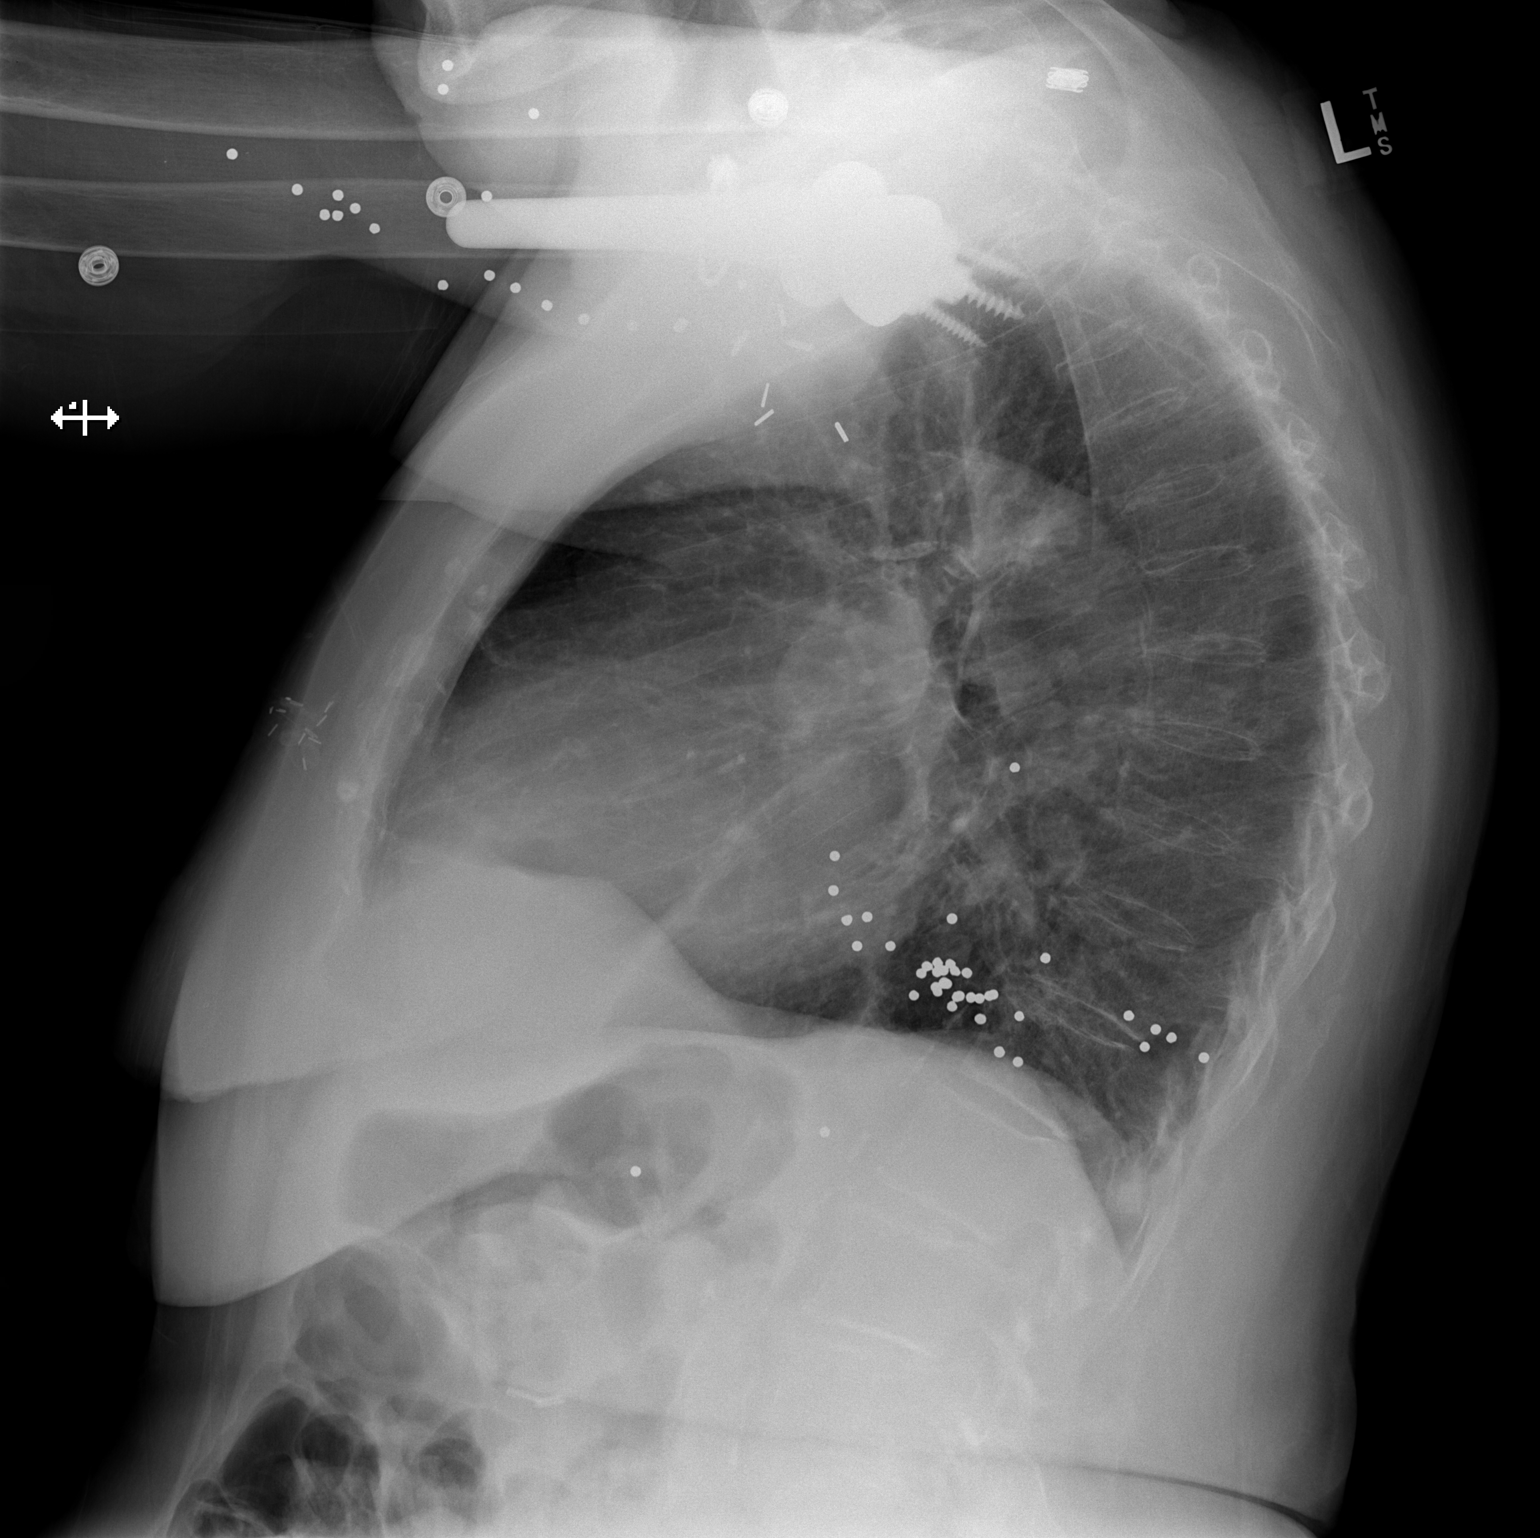

[2 of 2 positions shown; findings below may reference images not displayed]

FINDINGS: There is no edema or airspace opacity. Heart is upper normal in size
with pulmonary vascularity normal. There is aortic atherosclerosis.
There is apparent hiatal hernia. There is postoperative and
posttraumatic change over the right hemithorax. There is a total
shoulder replacement on the left. Bones are osteoporotic.
IMPRESSION: No edema or airspace opacity. Heart is upper normal in size.
Apparent hiatal hernia. Postoperative and posttraumatic changes
noted.

Aortic Atherosclerosis ([id number]).

## 2021-10-08 DIAGNOSIS — Z0000 Encounter for general adult medical examination without abnormal findings: Secondary | ICD-10-CM | POA: Diagnosis not present

## 2021-10-08 DIAGNOSIS — E039 Hypothyroidism, unspecified: Secondary | ICD-10-CM | POA: Diagnosis not present

## 2021-10-08 DIAGNOSIS — Z79899 Other long term (current) drug therapy: Secondary | ICD-10-CM | POA: Diagnosis not present

## 2021-10-08 DIAGNOSIS — Z6834 Body mass index (BMI) 34.0-34.9, adult: Secondary | ICD-10-CM | POA: Diagnosis not present

## 2021-10-08 DIAGNOSIS — E7800 Pure hypercholesterolemia, unspecified: Secondary | ICD-10-CM | POA: Diagnosis not present

## 2021-10-08 DIAGNOSIS — I10 Essential (primary) hypertension: Secondary | ICD-10-CM | POA: Diagnosis not present

## 2021-11-22 DIAGNOSIS — Z1231 Encounter for screening mammogram for malignant neoplasm of breast: Secondary | ICD-10-CM | POA: Diagnosis not present

## 2021-11-22 DIAGNOSIS — Z6834 Body mass index (BMI) 34.0-34.9, adult: Secondary | ICD-10-CM | POA: Diagnosis not present

## 2021-12-21 DIAGNOSIS — Z1231 Encounter for screening mammogram for malignant neoplasm of breast: Secondary | ICD-10-CM | POA: Diagnosis not present

## 2022-03-16 DIAGNOSIS — H02052 Trichiasis without entropian right lower eyelid: Secondary | ICD-10-CM | POA: Diagnosis not present

## 2022-04-05 DIAGNOSIS — I10 Essential (primary) hypertension: Secondary | ICD-10-CM | POA: Diagnosis not present

## 2022-04-05 DIAGNOSIS — F339 Major depressive disorder, recurrent, unspecified: Secondary | ICD-10-CM | POA: Diagnosis not present

## 2022-04-05 DIAGNOSIS — E7800 Pure hypercholesterolemia, unspecified: Secondary | ICD-10-CM | POA: Diagnosis not present

## 2022-04-05 DIAGNOSIS — Z6832 Body mass index (BMI) 32.0-32.9, adult: Secondary | ICD-10-CM | POA: Diagnosis not present

## 2022-04-05 DIAGNOSIS — E039 Hypothyroidism, unspecified: Secondary | ICD-10-CM | POA: Diagnosis not present

## 2022-04-05 DIAGNOSIS — G2581 Restless legs syndrome: Secondary | ICD-10-CM | POA: Diagnosis not present

## 2022-04-05 DIAGNOSIS — Z23 Encounter for immunization: Secondary | ICD-10-CM | POA: Diagnosis not present

## 2022-05-21 DIAGNOSIS — N3000 Acute cystitis without hematuria: Secondary | ICD-10-CM | POA: Diagnosis not present

## 2022-05-21 DIAGNOSIS — Z7401 Bed confinement status: Secondary | ICD-10-CM | POA: Diagnosis not present

## 2022-05-21 DIAGNOSIS — I700 Atherosclerosis of aorta: Secondary | ICD-10-CM | POA: Diagnosis not present

## 2022-05-21 DIAGNOSIS — I10 Essential (primary) hypertension: Secondary | ICD-10-CM | POA: Diagnosis not present

## 2022-05-21 DIAGNOSIS — J189 Pneumonia, unspecified organism: Secondary | ICD-10-CM | POA: Diagnosis not present

## 2022-05-21 DIAGNOSIS — R0602 Shortness of breath: Secondary | ICD-10-CM | POA: Diagnosis not present

## 2022-05-21 DIAGNOSIS — N390 Urinary tract infection, site not specified: Secondary | ICD-10-CM | POA: Diagnosis not present

## 2022-05-21 DIAGNOSIS — F03918 Unspecified dementia, unspecified severity, with other behavioral disturbance: Secondary | ICD-10-CM | POA: Diagnosis not present

## 2022-05-21 DIAGNOSIS — I499 Cardiac arrhythmia, unspecified: Secondary | ICD-10-CM | POA: Diagnosis not present

## 2022-05-21 DIAGNOSIS — M1990 Unspecified osteoarthritis, unspecified site: Secondary | ICD-10-CM | POA: Diagnosis not present

## 2022-05-21 DIAGNOSIS — R0789 Other chest pain: Secondary | ICD-10-CM | POA: Diagnosis not present

## 2022-05-21 DIAGNOSIS — B9620 Unspecified Escherichia coli [E. coli] as the cause of diseases classified elsewhere: Secondary | ICD-10-CM | POA: Diagnosis not present

## 2022-05-21 DIAGNOSIS — F05 Delirium due to known physiological condition: Secondary | ICD-10-CM | POA: Diagnosis not present

## 2022-05-21 DIAGNOSIS — R079 Chest pain, unspecified: Secondary | ICD-10-CM | POA: Diagnosis not present

## 2022-05-21 DIAGNOSIS — D72829 Elevated white blood cell count, unspecified: Secondary | ICD-10-CM | POA: Diagnosis not present

## 2022-05-21 DIAGNOSIS — E7800 Pure hypercholesterolemia, unspecified: Secondary | ICD-10-CM | POA: Diagnosis not present

## 2022-05-21 DIAGNOSIS — I2584 Coronary atherosclerosis due to calcified coronary lesion: Secondary | ICD-10-CM | POA: Diagnosis not present

## 2022-05-21 DIAGNOSIS — J9811 Atelectasis: Secondary | ICD-10-CM | POA: Diagnosis not present

## 2022-05-26 DIAGNOSIS — I2510 Atherosclerotic heart disease of native coronary artery without angina pectoris: Secondary | ICD-10-CM | POA: Diagnosis not present

## 2022-05-26 DIAGNOSIS — Z7401 Bed confinement status: Secondary | ICD-10-CM | POA: Diagnosis not present

## 2022-05-26 DIAGNOSIS — F05 Delirium due to known physiological condition: Secondary | ICD-10-CM | POA: Diagnosis not present

## 2022-05-26 DIAGNOSIS — I10 Essential (primary) hypertension: Secondary | ICD-10-CM | POA: Diagnosis not present

## 2022-05-26 DIAGNOSIS — F03918 Unspecified dementia, unspecified severity, with other behavioral disturbance: Secondary | ICD-10-CM | POA: Diagnosis not present

## 2022-05-26 DIAGNOSIS — M810 Age-related osteoporosis without current pathological fracture: Secondary | ICD-10-CM | POA: Diagnosis not present

## 2022-05-26 DIAGNOSIS — N390 Urinary tract infection, site not specified: Secondary | ICD-10-CM | POA: Diagnosis not present

## 2022-05-26 DIAGNOSIS — I700 Atherosclerosis of aorta: Secondary | ICD-10-CM | POA: Diagnosis not present

## 2022-05-26 DIAGNOSIS — I119 Hypertensive heart disease without heart failure: Secondary | ICD-10-CM | POA: Diagnosis not present

## 2022-05-26 DIAGNOSIS — R262 Difficulty in walking, not elsewhere classified: Secondary | ICD-10-CM | POA: Diagnosis not present

## 2022-05-26 DIAGNOSIS — I2584 Coronary atherosclerosis due to calcified coronary lesion: Secondary | ICD-10-CM | POA: Diagnosis not present

## 2022-05-28 DIAGNOSIS — I119 Hypertensive heart disease without heart failure: Secondary | ICD-10-CM | POA: Diagnosis not present

## 2022-05-28 DIAGNOSIS — M810 Age-related osteoporosis without current pathological fracture: Secondary | ICD-10-CM | POA: Diagnosis not present

## 2022-05-28 DIAGNOSIS — R262 Difficulty in walking, not elsewhere classified: Secondary | ICD-10-CM | POA: Diagnosis not present

## 2022-05-28 DIAGNOSIS — I2510 Atherosclerotic heart disease of native coronary artery without angina pectoris: Secondary | ICD-10-CM | POA: Diagnosis not present

## 2022-06-14 DIAGNOSIS — Z6831 Body mass index (BMI) 31.0-31.9, adult: Secondary | ICD-10-CM | POA: Diagnosis not present

## 2022-06-14 DIAGNOSIS — F03911 Unspecified dementia, unspecified severity, with agitation: Secondary | ICD-10-CM | POA: Diagnosis not present

## 2022-06-14 DIAGNOSIS — B9620 Unspecified Escherichia coli [E. coli] as the cause of diseases classified elsewhere: Secondary | ICD-10-CM | POA: Diagnosis not present

## 2022-06-14 DIAGNOSIS — N390 Urinary tract infection, site not specified: Secondary | ICD-10-CM | POA: Diagnosis not present

## 2022-06-15 DIAGNOSIS — J45909 Unspecified asthma, uncomplicated: Secondary | ICD-10-CM | POA: Diagnosis not present

## 2022-06-15 DIAGNOSIS — E039 Hypothyroidism, unspecified: Secondary | ICD-10-CM | POA: Diagnosis not present

## 2022-06-15 DIAGNOSIS — N3000 Acute cystitis without hematuria: Secondary | ICD-10-CM | POA: Diagnosis not present

## 2022-06-15 DIAGNOSIS — I2510 Atherosclerotic heart disease of native coronary artery without angina pectoris: Secondary | ICD-10-CM | POA: Diagnosis not present

## 2022-06-15 DIAGNOSIS — I119 Hypertensive heart disease without heart failure: Secondary | ICD-10-CM | POA: Diagnosis not present

## 2022-06-15 DIAGNOSIS — F0283 Dementia in other diseases classified elsewhere, unspecified severity, with mood disturbance: Secondary | ICD-10-CM | POA: Diagnosis not present

## 2022-06-15 DIAGNOSIS — M810 Age-related osteoporosis without current pathological fracture: Secondary | ICD-10-CM | POA: Diagnosis not present

## 2022-06-15 DIAGNOSIS — D649 Anemia, unspecified: Secondary | ICD-10-CM | POA: Diagnosis not present

## 2022-06-15 DIAGNOSIS — F0284 Dementia in other diseases classified elsewhere, unspecified severity, with anxiety: Secondary | ICD-10-CM | POA: Diagnosis not present

## 2022-06-16 DIAGNOSIS — F0283 Dementia in other diseases classified elsewhere, unspecified severity, with mood disturbance: Secondary | ICD-10-CM | POA: Diagnosis not present

## 2022-06-16 DIAGNOSIS — M810 Age-related osteoporosis without current pathological fracture: Secondary | ICD-10-CM | POA: Diagnosis not present

## 2022-06-16 DIAGNOSIS — D649 Anemia, unspecified: Secondary | ICD-10-CM | POA: Diagnosis not present

## 2022-06-16 DIAGNOSIS — I119 Hypertensive heart disease without heart failure: Secondary | ICD-10-CM | POA: Diagnosis not present

## 2022-06-16 DIAGNOSIS — J45909 Unspecified asthma, uncomplicated: Secondary | ICD-10-CM | POA: Diagnosis not present

## 2022-06-16 DIAGNOSIS — E039 Hypothyroidism, unspecified: Secondary | ICD-10-CM | POA: Diagnosis not present

## 2022-06-16 DIAGNOSIS — F0284 Dementia in other diseases classified elsewhere, unspecified severity, with anxiety: Secondary | ICD-10-CM | POA: Diagnosis not present

## 2022-06-16 DIAGNOSIS — N3000 Acute cystitis without hematuria: Secondary | ICD-10-CM | POA: Diagnosis not present

## 2022-06-16 DIAGNOSIS — I2510 Atherosclerotic heart disease of native coronary artery without angina pectoris: Secondary | ICD-10-CM | POA: Diagnosis not present

## 2022-06-17 DIAGNOSIS — I119 Hypertensive heart disease without heart failure: Secondary | ICD-10-CM | POA: Diagnosis not present

## 2022-06-17 DIAGNOSIS — E039 Hypothyroidism, unspecified: Secondary | ICD-10-CM | POA: Diagnosis not present

## 2022-06-17 DIAGNOSIS — N3000 Acute cystitis without hematuria: Secondary | ICD-10-CM | POA: Diagnosis not present

## 2022-06-17 DIAGNOSIS — D649 Anemia, unspecified: Secondary | ICD-10-CM | POA: Diagnosis not present

## 2022-06-17 DIAGNOSIS — F0283 Dementia in other diseases classified elsewhere, unspecified severity, with mood disturbance: Secondary | ICD-10-CM | POA: Diagnosis not present

## 2022-06-17 DIAGNOSIS — F0284 Dementia in other diseases classified elsewhere, unspecified severity, with anxiety: Secondary | ICD-10-CM | POA: Diagnosis not present

## 2022-06-17 DIAGNOSIS — M810 Age-related osteoporosis without current pathological fracture: Secondary | ICD-10-CM | POA: Diagnosis not present

## 2022-06-17 DIAGNOSIS — I2510 Atherosclerotic heart disease of native coronary artery without angina pectoris: Secondary | ICD-10-CM | POA: Diagnosis not present

## 2022-06-17 DIAGNOSIS — J45909 Unspecified asthma, uncomplicated: Secondary | ICD-10-CM | POA: Diagnosis not present

## 2022-06-20 DIAGNOSIS — I119 Hypertensive heart disease without heart failure: Secondary | ICD-10-CM | POA: Diagnosis not present

## 2022-06-20 DIAGNOSIS — N3000 Acute cystitis without hematuria: Secondary | ICD-10-CM | POA: Diagnosis not present

## 2022-06-20 DIAGNOSIS — E039 Hypothyroidism, unspecified: Secondary | ICD-10-CM | POA: Diagnosis not present

## 2022-06-20 DIAGNOSIS — I2510 Atherosclerotic heart disease of native coronary artery without angina pectoris: Secondary | ICD-10-CM | POA: Diagnosis not present

## 2022-06-20 DIAGNOSIS — J45909 Unspecified asthma, uncomplicated: Secondary | ICD-10-CM | POA: Diagnosis not present

## 2022-06-20 DIAGNOSIS — F0283 Dementia in other diseases classified elsewhere, unspecified severity, with mood disturbance: Secondary | ICD-10-CM | POA: Diagnosis not present

## 2022-06-20 DIAGNOSIS — D649 Anemia, unspecified: Secondary | ICD-10-CM | POA: Diagnosis not present

## 2022-06-20 DIAGNOSIS — F0284 Dementia in other diseases classified elsewhere, unspecified severity, with anxiety: Secondary | ICD-10-CM | POA: Diagnosis not present

## 2022-06-20 DIAGNOSIS — M810 Age-related osteoporosis without current pathological fracture: Secondary | ICD-10-CM | POA: Diagnosis not present

## 2022-06-21 DIAGNOSIS — I119 Hypertensive heart disease without heart failure: Secondary | ICD-10-CM | POA: Diagnosis not present

## 2022-06-21 DIAGNOSIS — D649 Anemia, unspecified: Secondary | ICD-10-CM | POA: Diagnosis not present

## 2022-06-21 DIAGNOSIS — N3000 Acute cystitis without hematuria: Secondary | ICD-10-CM | POA: Diagnosis not present

## 2022-06-21 DIAGNOSIS — E039 Hypothyroidism, unspecified: Secondary | ICD-10-CM | POA: Diagnosis not present

## 2022-06-21 DIAGNOSIS — I2510 Atherosclerotic heart disease of native coronary artery without angina pectoris: Secondary | ICD-10-CM | POA: Diagnosis not present

## 2022-06-21 DIAGNOSIS — F0283 Dementia in other diseases classified elsewhere, unspecified severity, with mood disturbance: Secondary | ICD-10-CM | POA: Diagnosis not present

## 2022-06-21 DIAGNOSIS — F0284 Dementia in other diseases classified elsewhere, unspecified severity, with anxiety: Secondary | ICD-10-CM | POA: Diagnosis not present

## 2022-06-21 DIAGNOSIS — J45909 Unspecified asthma, uncomplicated: Secondary | ICD-10-CM | POA: Diagnosis not present

## 2022-06-21 DIAGNOSIS — M810 Age-related osteoporosis without current pathological fracture: Secondary | ICD-10-CM | POA: Diagnosis not present

## 2022-06-22 DIAGNOSIS — F0284 Dementia in other diseases classified elsewhere, unspecified severity, with anxiety: Secondary | ICD-10-CM | POA: Diagnosis not present

## 2022-06-22 DIAGNOSIS — N3000 Acute cystitis without hematuria: Secondary | ICD-10-CM | POA: Diagnosis not present

## 2022-06-22 DIAGNOSIS — I119 Hypertensive heart disease without heart failure: Secondary | ICD-10-CM | POA: Diagnosis not present

## 2022-06-22 DIAGNOSIS — J45909 Unspecified asthma, uncomplicated: Secondary | ICD-10-CM | POA: Diagnosis not present

## 2022-06-22 DIAGNOSIS — F0283 Dementia in other diseases classified elsewhere, unspecified severity, with mood disturbance: Secondary | ICD-10-CM | POA: Diagnosis not present

## 2022-06-22 DIAGNOSIS — D649 Anemia, unspecified: Secondary | ICD-10-CM | POA: Diagnosis not present

## 2022-06-22 DIAGNOSIS — E039 Hypothyroidism, unspecified: Secondary | ICD-10-CM | POA: Diagnosis not present

## 2022-06-22 DIAGNOSIS — M810 Age-related osteoporosis without current pathological fracture: Secondary | ICD-10-CM | POA: Diagnosis not present

## 2022-06-22 DIAGNOSIS — I2510 Atherosclerotic heart disease of native coronary artery without angina pectoris: Secondary | ICD-10-CM | POA: Diagnosis not present

## 2022-06-23 DIAGNOSIS — I2510 Atherosclerotic heart disease of native coronary artery without angina pectoris: Secondary | ICD-10-CM | POA: Diagnosis not present

## 2022-06-23 DIAGNOSIS — M810 Age-related osteoporosis without current pathological fracture: Secondary | ICD-10-CM | POA: Diagnosis not present

## 2022-06-23 DIAGNOSIS — I119 Hypertensive heart disease without heart failure: Secondary | ICD-10-CM | POA: Diagnosis not present

## 2022-06-23 DIAGNOSIS — F0283 Dementia in other diseases classified elsewhere, unspecified severity, with mood disturbance: Secondary | ICD-10-CM | POA: Diagnosis not present

## 2022-06-23 DIAGNOSIS — D649 Anemia, unspecified: Secondary | ICD-10-CM | POA: Diagnosis not present

## 2022-06-23 DIAGNOSIS — E039 Hypothyroidism, unspecified: Secondary | ICD-10-CM | POA: Diagnosis not present

## 2022-06-23 DIAGNOSIS — J45909 Unspecified asthma, uncomplicated: Secondary | ICD-10-CM | POA: Diagnosis not present

## 2022-06-23 DIAGNOSIS — N3000 Acute cystitis without hematuria: Secondary | ICD-10-CM | POA: Diagnosis not present

## 2022-06-23 DIAGNOSIS — F0284 Dementia in other diseases classified elsewhere, unspecified severity, with anxiety: Secondary | ICD-10-CM | POA: Diagnosis not present

## 2022-06-27 DIAGNOSIS — F0283 Dementia in other diseases classified elsewhere, unspecified severity, with mood disturbance: Secondary | ICD-10-CM | POA: Diagnosis not present

## 2022-06-27 DIAGNOSIS — M810 Age-related osteoporosis without current pathological fracture: Secondary | ICD-10-CM | POA: Diagnosis not present

## 2022-06-27 DIAGNOSIS — F0284 Dementia in other diseases classified elsewhere, unspecified severity, with anxiety: Secondary | ICD-10-CM | POA: Diagnosis not present

## 2022-06-27 DIAGNOSIS — E039 Hypothyroidism, unspecified: Secondary | ICD-10-CM | POA: Diagnosis not present

## 2022-06-27 DIAGNOSIS — I119 Hypertensive heart disease without heart failure: Secondary | ICD-10-CM | POA: Diagnosis not present

## 2022-06-27 DIAGNOSIS — J45909 Unspecified asthma, uncomplicated: Secondary | ICD-10-CM | POA: Diagnosis not present

## 2022-06-27 DIAGNOSIS — N3000 Acute cystitis without hematuria: Secondary | ICD-10-CM | POA: Diagnosis not present

## 2022-06-27 DIAGNOSIS — D649 Anemia, unspecified: Secondary | ICD-10-CM | POA: Diagnosis not present

## 2022-06-27 DIAGNOSIS — I2510 Atherosclerotic heart disease of native coronary artery without angina pectoris: Secondary | ICD-10-CM | POA: Diagnosis not present

## 2022-06-29 DIAGNOSIS — I2510 Atherosclerotic heart disease of native coronary artery without angina pectoris: Secondary | ICD-10-CM | POA: Diagnosis not present

## 2022-06-29 DIAGNOSIS — N3000 Acute cystitis without hematuria: Secondary | ICD-10-CM | POA: Diagnosis not present

## 2022-06-29 DIAGNOSIS — F0283 Dementia in other diseases classified elsewhere, unspecified severity, with mood disturbance: Secondary | ICD-10-CM | POA: Diagnosis not present

## 2022-06-29 DIAGNOSIS — J45909 Unspecified asthma, uncomplicated: Secondary | ICD-10-CM | POA: Diagnosis not present

## 2022-06-29 DIAGNOSIS — E039 Hypothyroidism, unspecified: Secondary | ICD-10-CM | POA: Diagnosis not present

## 2022-06-29 DIAGNOSIS — I119 Hypertensive heart disease without heart failure: Secondary | ICD-10-CM | POA: Diagnosis not present

## 2022-06-29 DIAGNOSIS — M810 Age-related osteoporosis without current pathological fracture: Secondary | ICD-10-CM | POA: Diagnosis not present

## 2022-06-29 DIAGNOSIS — F0284 Dementia in other diseases classified elsewhere, unspecified severity, with anxiety: Secondary | ICD-10-CM | POA: Diagnosis not present

## 2022-06-29 DIAGNOSIS — D649 Anemia, unspecified: Secondary | ICD-10-CM | POA: Diagnosis not present

## 2022-07-04 DIAGNOSIS — F0284 Dementia in other diseases classified elsewhere, unspecified severity, with anxiety: Secondary | ICD-10-CM | POA: Diagnosis not present

## 2022-07-04 DIAGNOSIS — E039 Hypothyroidism, unspecified: Secondary | ICD-10-CM | POA: Diagnosis not present

## 2022-07-04 DIAGNOSIS — J45909 Unspecified asthma, uncomplicated: Secondary | ICD-10-CM | POA: Diagnosis not present

## 2022-07-04 DIAGNOSIS — F0283 Dementia in other diseases classified elsewhere, unspecified severity, with mood disturbance: Secondary | ICD-10-CM | POA: Diagnosis not present

## 2022-07-04 DIAGNOSIS — D649 Anemia, unspecified: Secondary | ICD-10-CM | POA: Diagnosis not present

## 2022-07-04 DIAGNOSIS — I2510 Atherosclerotic heart disease of native coronary artery without angina pectoris: Secondary | ICD-10-CM | POA: Diagnosis not present

## 2022-07-04 DIAGNOSIS — N3000 Acute cystitis without hematuria: Secondary | ICD-10-CM | POA: Diagnosis not present

## 2022-07-04 DIAGNOSIS — I119 Hypertensive heart disease without heart failure: Secondary | ICD-10-CM | POA: Diagnosis not present

## 2022-07-04 DIAGNOSIS — M810 Age-related osteoporosis without current pathological fracture: Secondary | ICD-10-CM | POA: Diagnosis not present

## 2022-07-06 DIAGNOSIS — I119 Hypertensive heart disease without heart failure: Secondary | ICD-10-CM | POA: Diagnosis not present

## 2022-07-06 DIAGNOSIS — E039 Hypothyroidism, unspecified: Secondary | ICD-10-CM | POA: Diagnosis not present

## 2022-07-06 DIAGNOSIS — F0284 Dementia in other diseases classified elsewhere, unspecified severity, with anxiety: Secondary | ICD-10-CM | POA: Diagnosis not present

## 2022-07-06 DIAGNOSIS — D649 Anemia, unspecified: Secondary | ICD-10-CM | POA: Diagnosis not present

## 2022-07-06 DIAGNOSIS — J45909 Unspecified asthma, uncomplicated: Secondary | ICD-10-CM | POA: Diagnosis not present

## 2022-07-06 DIAGNOSIS — M810 Age-related osteoporosis without current pathological fracture: Secondary | ICD-10-CM | POA: Diagnosis not present

## 2022-07-06 DIAGNOSIS — I2510 Atherosclerotic heart disease of native coronary artery without angina pectoris: Secondary | ICD-10-CM | POA: Diagnosis not present

## 2022-07-06 DIAGNOSIS — F0283 Dementia in other diseases classified elsewhere, unspecified severity, with mood disturbance: Secondary | ICD-10-CM | POA: Diagnosis not present

## 2022-07-06 DIAGNOSIS — N3000 Acute cystitis without hematuria: Secondary | ICD-10-CM | POA: Diagnosis not present

## 2022-07-11 DIAGNOSIS — D649 Anemia, unspecified: Secondary | ICD-10-CM | POA: Diagnosis not present

## 2022-07-11 DIAGNOSIS — F0283 Dementia in other diseases classified elsewhere, unspecified severity, with mood disturbance: Secondary | ICD-10-CM | POA: Diagnosis not present

## 2022-07-11 DIAGNOSIS — I119 Hypertensive heart disease without heart failure: Secondary | ICD-10-CM | POA: Diagnosis not present

## 2022-07-11 DIAGNOSIS — E039 Hypothyroidism, unspecified: Secondary | ICD-10-CM | POA: Diagnosis not present

## 2022-07-11 DIAGNOSIS — F0284 Dementia in other diseases classified elsewhere, unspecified severity, with anxiety: Secondary | ICD-10-CM | POA: Diagnosis not present

## 2022-07-11 DIAGNOSIS — N3000 Acute cystitis without hematuria: Secondary | ICD-10-CM | POA: Diagnosis not present

## 2022-07-11 DIAGNOSIS — J45909 Unspecified asthma, uncomplicated: Secondary | ICD-10-CM | POA: Diagnosis not present

## 2022-07-11 DIAGNOSIS — M810 Age-related osteoporosis without current pathological fracture: Secondary | ICD-10-CM | POA: Diagnosis not present

## 2022-07-11 DIAGNOSIS — I2510 Atherosclerotic heart disease of native coronary artery without angina pectoris: Secondary | ICD-10-CM | POA: Diagnosis not present

## 2022-07-12 DIAGNOSIS — E039 Hypothyroidism, unspecified: Secondary | ICD-10-CM | POA: Diagnosis not present

## 2022-07-12 DIAGNOSIS — D649 Anemia, unspecified: Secondary | ICD-10-CM | POA: Diagnosis not present

## 2022-07-12 DIAGNOSIS — M810 Age-related osteoporosis without current pathological fracture: Secondary | ICD-10-CM | POA: Diagnosis not present

## 2022-07-12 DIAGNOSIS — N3000 Acute cystitis without hematuria: Secondary | ICD-10-CM | POA: Diagnosis not present

## 2022-07-12 DIAGNOSIS — J45909 Unspecified asthma, uncomplicated: Secondary | ICD-10-CM | POA: Diagnosis not present

## 2022-07-12 DIAGNOSIS — F0283 Dementia in other diseases classified elsewhere, unspecified severity, with mood disturbance: Secondary | ICD-10-CM | POA: Diagnosis not present

## 2022-07-12 DIAGNOSIS — I119 Hypertensive heart disease without heart failure: Secondary | ICD-10-CM | POA: Diagnosis not present

## 2022-07-12 DIAGNOSIS — F0284 Dementia in other diseases classified elsewhere, unspecified severity, with anxiety: Secondary | ICD-10-CM | POA: Diagnosis not present

## 2022-07-12 DIAGNOSIS — I2510 Atherosclerotic heart disease of native coronary artery without angina pectoris: Secondary | ICD-10-CM | POA: Diagnosis not present

## 2022-07-14 DIAGNOSIS — J45909 Unspecified asthma, uncomplicated: Secondary | ICD-10-CM | POA: Diagnosis not present

## 2022-07-14 DIAGNOSIS — I119 Hypertensive heart disease without heart failure: Secondary | ICD-10-CM | POA: Diagnosis not present

## 2022-07-14 DIAGNOSIS — E039 Hypothyroidism, unspecified: Secondary | ICD-10-CM | POA: Diagnosis not present

## 2022-07-14 DIAGNOSIS — D649 Anemia, unspecified: Secondary | ICD-10-CM | POA: Diagnosis not present

## 2022-07-14 DIAGNOSIS — F0284 Dementia in other diseases classified elsewhere, unspecified severity, with anxiety: Secondary | ICD-10-CM | POA: Diagnosis not present

## 2022-07-14 DIAGNOSIS — F0283 Dementia in other diseases classified elsewhere, unspecified severity, with mood disturbance: Secondary | ICD-10-CM | POA: Diagnosis not present

## 2022-07-14 DIAGNOSIS — M810 Age-related osteoporosis without current pathological fracture: Secondary | ICD-10-CM | POA: Diagnosis not present

## 2022-07-14 DIAGNOSIS — I2510 Atherosclerotic heart disease of native coronary artery without angina pectoris: Secondary | ICD-10-CM | POA: Diagnosis not present

## 2022-07-14 DIAGNOSIS — N3000 Acute cystitis without hematuria: Secondary | ICD-10-CM | POA: Diagnosis not present

## 2022-07-15 DIAGNOSIS — F0283 Dementia in other diseases classified elsewhere, unspecified severity, with mood disturbance: Secondary | ICD-10-CM | POA: Diagnosis not present

## 2022-07-15 DIAGNOSIS — J45909 Unspecified asthma, uncomplicated: Secondary | ICD-10-CM | POA: Diagnosis not present

## 2022-07-15 DIAGNOSIS — F0284 Dementia in other diseases classified elsewhere, unspecified severity, with anxiety: Secondary | ICD-10-CM | POA: Diagnosis not present

## 2022-07-15 DIAGNOSIS — E039 Hypothyroidism, unspecified: Secondary | ICD-10-CM | POA: Diagnosis not present

## 2022-07-15 DIAGNOSIS — D649 Anemia, unspecified: Secondary | ICD-10-CM | POA: Diagnosis not present

## 2022-07-15 DIAGNOSIS — I2510 Atherosclerotic heart disease of native coronary artery without angina pectoris: Secondary | ICD-10-CM | POA: Diagnosis not present

## 2022-07-15 DIAGNOSIS — N3000 Acute cystitis without hematuria: Secondary | ICD-10-CM | POA: Diagnosis not present

## 2022-07-15 DIAGNOSIS — M810 Age-related osteoporosis without current pathological fracture: Secondary | ICD-10-CM | POA: Diagnosis not present

## 2022-07-15 DIAGNOSIS — I119 Hypertensive heart disease without heart failure: Secondary | ICD-10-CM | POA: Diagnosis not present

## 2022-07-18 DIAGNOSIS — I2510 Atherosclerotic heart disease of native coronary artery without angina pectoris: Secondary | ICD-10-CM | POA: Diagnosis not present

## 2022-07-18 DIAGNOSIS — F0284 Dementia in other diseases classified elsewhere, unspecified severity, with anxiety: Secondary | ICD-10-CM | POA: Diagnosis not present

## 2022-07-18 DIAGNOSIS — M810 Age-related osteoporosis without current pathological fracture: Secondary | ICD-10-CM | POA: Diagnosis not present

## 2022-07-18 DIAGNOSIS — F0283 Dementia in other diseases classified elsewhere, unspecified severity, with mood disturbance: Secondary | ICD-10-CM | POA: Diagnosis not present

## 2022-07-18 DIAGNOSIS — I119 Hypertensive heart disease without heart failure: Secondary | ICD-10-CM | POA: Diagnosis not present

## 2022-07-18 DIAGNOSIS — E039 Hypothyroidism, unspecified: Secondary | ICD-10-CM | POA: Diagnosis not present

## 2022-07-18 DIAGNOSIS — N3000 Acute cystitis without hematuria: Secondary | ICD-10-CM | POA: Diagnosis not present

## 2022-07-18 DIAGNOSIS — J45909 Unspecified asthma, uncomplicated: Secondary | ICD-10-CM | POA: Diagnosis not present

## 2022-07-18 DIAGNOSIS — D649 Anemia, unspecified: Secondary | ICD-10-CM | POA: Diagnosis not present

## 2022-07-20 DIAGNOSIS — I10 Essential (primary) hypertension: Secondary | ICD-10-CM | POA: Diagnosis not present

## 2022-07-20 DIAGNOSIS — I700 Atherosclerosis of aorta: Secondary | ICD-10-CM | POA: Diagnosis not present

## 2022-07-21 DIAGNOSIS — N3000 Acute cystitis without hematuria: Secondary | ICD-10-CM | POA: Diagnosis not present

## 2022-07-21 DIAGNOSIS — J45909 Unspecified asthma, uncomplicated: Secondary | ICD-10-CM | POA: Diagnosis not present

## 2022-07-21 DIAGNOSIS — I2510 Atherosclerotic heart disease of native coronary artery without angina pectoris: Secondary | ICD-10-CM | POA: Diagnosis not present

## 2022-07-21 DIAGNOSIS — D649 Anemia, unspecified: Secondary | ICD-10-CM | POA: Diagnosis not present

## 2022-07-21 DIAGNOSIS — F0283 Dementia in other diseases classified elsewhere, unspecified severity, with mood disturbance: Secondary | ICD-10-CM | POA: Diagnosis not present

## 2022-07-21 DIAGNOSIS — I119 Hypertensive heart disease without heart failure: Secondary | ICD-10-CM | POA: Diagnosis not present

## 2022-07-21 DIAGNOSIS — M810 Age-related osteoporosis without current pathological fracture: Secondary | ICD-10-CM | POA: Diagnosis not present

## 2022-07-21 DIAGNOSIS — F0284 Dementia in other diseases classified elsewhere, unspecified severity, with anxiety: Secondary | ICD-10-CM | POA: Diagnosis not present

## 2022-07-21 DIAGNOSIS — E039 Hypothyroidism, unspecified: Secondary | ICD-10-CM | POA: Diagnosis not present

## 2022-07-25 DIAGNOSIS — F0284 Dementia in other diseases classified elsewhere, unspecified severity, with anxiety: Secondary | ICD-10-CM | POA: Diagnosis not present

## 2022-07-25 DIAGNOSIS — D649 Anemia, unspecified: Secondary | ICD-10-CM | POA: Diagnosis not present

## 2022-07-25 DIAGNOSIS — N3000 Acute cystitis without hematuria: Secondary | ICD-10-CM | POA: Diagnosis not present

## 2022-07-25 DIAGNOSIS — F0283 Dementia in other diseases classified elsewhere, unspecified severity, with mood disturbance: Secondary | ICD-10-CM | POA: Diagnosis not present

## 2022-07-25 DIAGNOSIS — E039 Hypothyroidism, unspecified: Secondary | ICD-10-CM | POA: Diagnosis not present

## 2022-07-25 DIAGNOSIS — J45909 Unspecified asthma, uncomplicated: Secondary | ICD-10-CM | POA: Diagnosis not present

## 2022-07-25 DIAGNOSIS — I119 Hypertensive heart disease without heart failure: Secondary | ICD-10-CM | POA: Diagnosis not present

## 2022-07-25 DIAGNOSIS — M810 Age-related osteoporosis without current pathological fracture: Secondary | ICD-10-CM | POA: Diagnosis not present

## 2022-07-25 DIAGNOSIS — I2510 Atherosclerotic heart disease of native coronary artery without angina pectoris: Secondary | ICD-10-CM | POA: Diagnosis not present

## 2022-07-26 DIAGNOSIS — N390 Urinary tract infection, site not specified: Secondary | ICD-10-CM | POA: Diagnosis not present

## 2022-07-27 DIAGNOSIS — I119 Hypertensive heart disease without heart failure: Secondary | ICD-10-CM | POA: Diagnosis not present

## 2022-07-27 DIAGNOSIS — N3000 Acute cystitis without hematuria: Secondary | ICD-10-CM | POA: Diagnosis not present

## 2022-07-27 DIAGNOSIS — E039 Hypothyroidism, unspecified: Secondary | ICD-10-CM | POA: Diagnosis not present

## 2022-07-27 DIAGNOSIS — J45909 Unspecified asthma, uncomplicated: Secondary | ICD-10-CM | POA: Diagnosis not present

## 2022-07-27 DIAGNOSIS — F0283 Dementia in other diseases classified elsewhere, unspecified severity, with mood disturbance: Secondary | ICD-10-CM | POA: Diagnosis not present

## 2022-07-27 DIAGNOSIS — M810 Age-related osteoporosis without current pathological fracture: Secondary | ICD-10-CM | POA: Diagnosis not present

## 2022-07-27 DIAGNOSIS — I2510 Atherosclerotic heart disease of native coronary artery without angina pectoris: Secondary | ICD-10-CM | POA: Diagnosis not present

## 2022-07-27 DIAGNOSIS — F0284 Dementia in other diseases classified elsewhere, unspecified severity, with anxiety: Secondary | ICD-10-CM | POA: Diagnosis not present

## 2022-07-27 DIAGNOSIS — D649 Anemia, unspecified: Secondary | ICD-10-CM | POA: Diagnosis not present

## 2022-07-29 DIAGNOSIS — I2510 Atherosclerotic heart disease of native coronary artery without angina pectoris: Secondary | ICD-10-CM | POA: Diagnosis not present

## 2022-07-29 DIAGNOSIS — E039 Hypothyroidism, unspecified: Secondary | ICD-10-CM | POA: Diagnosis not present

## 2022-07-29 DIAGNOSIS — I119 Hypertensive heart disease without heart failure: Secondary | ICD-10-CM | POA: Diagnosis not present

## 2022-07-29 DIAGNOSIS — N3000 Acute cystitis without hematuria: Secondary | ICD-10-CM | POA: Diagnosis not present

## 2022-07-29 DIAGNOSIS — M810 Age-related osteoporosis without current pathological fracture: Secondary | ICD-10-CM | POA: Diagnosis not present

## 2022-07-29 DIAGNOSIS — F0284 Dementia in other diseases classified elsewhere, unspecified severity, with anxiety: Secondary | ICD-10-CM | POA: Diagnosis not present

## 2022-07-29 DIAGNOSIS — F0283 Dementia in other diseases classified elsewhere, unspecified severity, with mood disturbance: Secondary | ICD-10-CM | POA: Diagnosis not present

## 2022-07-29 DIAGNOSIS — D649 Anemia, unspecified: Secondary | ICD-10-CM | POA: Diagnosis not present

## 2022-07-29 DIAGNOSIS — J45909 Unspecified asthma, uncomplicated: Secondary | ICD-10-CM | POA: Diagnosis not present

## 2022-08-01 DIAGNOSIS — F0283 Dementia in other diseases classified elsewhere, unspecified severity, with mood disturbance: Secondary | ICD-10-CM | POA: Diagnosis not present

## 2022-08-01 DIAGNOSIS — F0284 Dementia in other diseases classified elsewhere, unspecified severity, with anxiety: Secondary | ICD-10-CM | POA: Diagnosis not present

## 2022-08-01 DIAGNOSIS — M810 Age-related osteoporosis without current pathological fracture: Secondary | ICD-10-CM | POA: Diagnosis not present

## 2022-08-01 DIAGNOSIS — N3000 Acute cystitis without hematuria: Secondary | ICD-10-CM | POA: Diagnosis not present

## 2022-08-01 DIAGNOSIS — I119 Hypertensive heart disease without heart failure: Secondary | ICD-10-CM | POA: Diagnosis not present

## 2022-08-01 DIAGNOSIS — J45909 Unspecified asthma, uncomplicated: Secondary | ICD-10-CM | POA: Diagnosis not present

## 2022-08-01 DIAGNOSIS — D649 Anemia, unspecified: Secondary | ICD-10-CM | POA: Diagnosis not present

## 2022-08-01 DIAGNOSIS — I2510 Atherosclerotic heart disease of native coronary artery without angina pectoris: Secondary | ICD-10-CM | POA: Diagnosis not present

## 2022-08-01 DIAGNOSIS — E039 Hypothyroidism, unspecified: Secondary | ICD-10-CM | POA: Diagnosis not present

## 2022-08-04 DIAGNOSIS — F0283 Dementia in other diseases classified elsewhere, unspecified severity, with mood disturbance: Secondary | ICD-10-CM | POA: Diagnosis not present

## 2022-08-04 DIAGNOSIS — M810 Age-related osteoporosis without current pathological fracture: Secondary | ICD-10-CM | POA: Diagnosis not present

## 2022-08-04 DIAGNOSIS — N3000 Acute cystitis without hematuria: Secondary | ICD-10-CM | POA: Diagnosis not present

## 2022-08-04 DIAGNOSIS — D649 Anemia, unspecified: Secondary | ICD-10-CM | POA: Diagnosis not present

## 2022-08-04 DIAGNOSIS — I2510 Atherosclerotic heart disease of native coronary artery without angina pectoris: Secondary | ICD-10-CM | POA: Diagnosis not present

## 2022-08-04 DIAGNOSIS — J45909 Unspecified asthma, uncomplicated: Secondary | ICD-10-CM | POA: Diagnosis not present

## 2022-08-04 DIAGNOSIS — I119 Hypertensive heart disease without heart failure: Secondary | ICD-10-CM | POA: Diagnosis not present

## 2022-08-04 DIAGNOSIS — E039 Hypothyroidism, unspecified: Secondary | ICD-10-CM | POA: Diagnosis not present

## 2022-08-04 DIAGNOSIS — F0284 Dementia in other diseases classified elsewhere, unspecified severity, with anxiety: Secondary | ICD-10-CM | POA: Diagnosis not present

## 2022-08-08 DIAGNOSIS — I2510 Atherosclerotic heart disease of native coronary artery without angina pectoris: Secondary | ICD-10-CM | POA: Diagnosis not present

## 2022-08-08 DIAGNOSIS — N3000 Acute cystitis without hematuria: Secondary | ICD-10-CM | POA: Diagnosis not present

## 2022-08-08 DIAGNOSIS — E039 Hypothyroidism, unspecified: Secondary | ICD-10-CM | POA: Diagnosis not present

## 2022-08-08 DIAGNOSIS — M810 Age-related osteoporosis without current pathological fracture: Secondary | ICD-10-CM | POA: Diagnosis not present

## 2022-08-08 DIAGNOSIS — D649 Anemia, unspecified: Secondary | ICD-10-CM | POA: Diagnosis not present

## 2022-08-08 DIAGNOSIS — I119 Hypertensive heart disease without heart failure: Secondary | ICD-10-CM | POA: Diagnosis not present

## 2022-08-08 DIAGNOSIS — F0284 Dementia in other diseases classified elsewhere, unspecified severity, with anxiety: Secondary | ICD-10-CM | POA: Diagnosis not present

## 2022-08-08 DIAGNOSIS — J45909 Unspecified asthma, uncomplicated: Secondary | ICD-10-CM | POA: Diagnosis not present

## 2022-08-08 DIAGNOSIS — F0283 Dementia in other diseases classified elsewhere, unspecified severity, with mood disturbance: Secondary | ICD-10-CM | POA: Diagnosis not present

## 2022-08-11 DIAGNOSIS — R300 Dysuria: Secondary | ICD-10-CM | POA: Diagnosis not present

## 2022-08-11 DIAGNOSIS — I700 Atherosclerosis of aorta: Secondary | ICD-10-CM | POA: Diagnosis not present

## 2022-08-11 DIAGNOSIS — I10 Essential (primary) hypertension: Secondary | ICD-10-CM | POA: Diagnosis not present

## 2022-09-12 DEATH — deceased
